# Patient Record
Sex: Female | Born: 1980 | Hispanic: Yes | Marital: Married | Smoking: Former smoker
Health system: Southern US, Community
[De-identification: ages and names within clinical notes are randomized; demographics above are authoritative.]

## PROBLEM LIST (undated history)

## (undated) ENCOUNTER — Ambulatory Visit (HOSPITAL_COMMUNITY): Admission: EM | Payer: Medicaid Other | Source: Home / Self Care

## (undated) DIAGNOSIS — F329 Major depressive disorder, single episode, unspecified: Secondary | ICD-10-CM

## (undated) DIAGNOSIS — F419 Anxiety disorder, unspecified: Secondary | ICD-10-CM

## (undated) DIAGNOSIS — R87619 Unspecified abnormal cytological findings in specimens from cervix uteri: Secondary | ICD-10-CM

## (undated) DIAGNOSIS — O139 Gestational [pregnancy-induced] hypertension without significant proteinuria, unspecified trimester: Secondary | ICD-10-CM

## (undated) DIAGNOSIS — F32A Depression, unspecified: Secondary | ICD-10-CM

## (undated) HISTORY — PX: TONSILLECTOMY AND ADENOIDECTOMY: SUR1326

## (undated) HISTORY — DX: Gestational (pregnancy-induced) hypertension without significant proteinuria, unspecified trimester: O13.9

## (undated) HISTORY — PX: COLPOSCOPY: SHX161

## (undated) HISTORY — DX: Anxiety disorder, unspecified: F41.9

## (undated) HISTORY — DX: Unspecified abnormal cytological findings in specimens from cervix uteri: R87.619

## (undated) HISTORY — PX: CERVICAL BIOPSY  W/ LOOP ELECTRODE EXCISION: SUR135

## (undated) HISTORY — DX: Depression, unspecified: F32.A

---
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
## 2017-09-28 ENCOUNTER — Emergency Department
Admission: EM | Admit: 2017-09-28 | Discharge: 2017-09-28 | Disposition: A | Discharge status: Home / Self Care | Payer: Self-pay | Attending: Emergency Medicine | Admitting: Emergency Medicine

## 2017-09-28 ENCOUNTER — Other Ambulatory Visit: Payer: Self-pay

## 2017-09-28 DIAGNOSIS — R1032 Left lower quadrant pain: Secondary | ICD-10-CM | POA: Insufficient documentation

## 2017-09-28 DIAGNOSIS — F17210 Nicotine dependence, cigarettes, uncomplicated: Secondary | ICD-10-CM | POA: Insufficient documentation

## 2017-09-28 DIAGNOSIS — R300 Dysuria: Secondary | ICD-10-CM | POA: Insufficient documentation

## 2017-09-28 DIAGNOSIS — N3090 Cystitis, unspecified without hematuria: Secondary | ICD-10-CM | POA: Insufficient documentation

## 2017-09-28 LAB — URINALYSIS, COMPLETE (UACMP) WITH MICROSCOPIC
BACTERIA UA: NONE SEEN
Bilirubin Urine: NEGATIVE
Glucose, UA: NEGATIVE mg/dL
Hgb urine dipstick: NEGATIVE
Ketones, ur: NEGATIVE mg/dL
Leukocytes, UA: NEGATIVE
Nitrite: NEGATIVE
Protein, ur: NEGATIVE mg/dL
SPECIFIC GRAVITY, URINE: 1.002 — AB (ref 1.005–1.030)
pH: 7 (ref 5.0–8.0)

## 2017-09-28 LAB — CBC WITH DIFFERENTIAL/PLATELET
BASOS ABS: 0.1 10*3/uL (ref 0–0.1)
Basophils Relative: 1 %
EOS ABS: 0.3 10*3/uL (ref 0–0.7)
EOS PCT: 4 %
HCT: 39.4 % (ref 35.0–47.0)
HEMOGLOBIN: 13.2 g/dL (ref 12.0–16.0)
LYMPHS ABS: 2.4 10*3/uL (ref 1.0–3.6)
LYMPHS PCT: 27 %
MCH: 31.2 pg (ref 26.0–34.0)
MCHC: 33.5 g/dL (ref 32.0–36.0)
MCV: 93.2 fL (ref 80.0–100.0)
Monocytes Absolute: 0.6 10*3/uL (ref 0.2–0.9)
Monocytes Relative: 7 %
NEUTROS PCT: 61 %
Neutro Abs: 5.5 10*3/uL (ref 1.4–6.5)
PLATELETS: 264 10*3/uL (ref 150–440)
RBC: 4.22 MIL/uL (ref 3.80–5.20)
RDW: 12.7 % (ref 11.5–14.5)
WBC: 8.9 10*3/uL (ref 3.6–11.0)

## 2017-09-28 LAB — COMPREHENSIVE METABOLIC PANEL
ALBUMIN: 4.5 g/dL (ref 3.5–5.0)
ALK PHOS: 82 U/L (ref 38–126)
ALT: 8 U/L — AB (ref 14–54)
AST: 18 U/L (ref 15–41)
Anion gap: 4 — ABNORMAL LOW (ref 5–15)
BUN: 9 mg/dL (ref 6–20)
CALCIUM: 9.1 mg/dL (ref 8.9–10.3)
CO2: 28 mmol/L (ref 22–32)
CREATININE: 0.75 mg/dL (ref 0.44–1.00)
Chloride: 103 mmol/L (ref 101–111)
GFR calc Af Amer: >60 mL/min (ref >60)
GFR calc non Af Amer: >60 mL/min (ref >60)
Glucose, Bld: 90 mg/dL (ref 65–99)
Potassium: 4 mmol/L (ref 3.5–5.1)
SODIUM: 135 mmol/L (ref 135–145)
Total Bilirubin: 0.6 mg/dL (ref 0.3–1.2)
Total Protein: 8 g/dL (ref 6.5–8.1)

## 2017-09-28 MED ORDER — MELOXICAM 15 MG PO TABS: 15.0000 mg | ORAL_TABLET | Freq: Every day | ORAL | 0 refills | Status: DC

## 2017-09-28 MED ORDER — PHENAZOPYRIDINE HCL 95 MG PO TABS: 95.0000 mg | ORAL_TABLET | Freq: Three times a day (TID) | ORAL | 0 refills | Status: DC | PRN

## 2017-09-28 NOTE — ED Notes (Signed)
See triage note.

## 2017-09-28 NOTE — ED Provider Notes (Signed)
[facility] Emergency Department Provider Note  ____________________________________________  Time seen: Approximately 3:51 PM  I have reviewed the triage vital signs and the nursing notes.   HISTORY  Chief Complaint Recurrent UTI    HPI Courtney Torres is a 37 y.o. female who presents emergency department complaining of urinary frequency, dysuria, flank pain.  Patient reports that she has a history of UTIs in the past, symptoms began the same way.  Patient reports that she had a UTI that turned into pyelonephritis and was borderline for admission in the past.  Patient reports that over the weekend she began to experience left flank pain.  She also has a feeling of urinary retention after urinating.  Patient denies any hematuria.  No abdominal pain.  No fevers or chills.  No recent strep infection or upper respiratory infection.  Patient has tried Azo several days ago with no relief.  No other medications for this complaint prior to arrival.  No recent antibiotic use.  History reviewed. No pertinent past medical history.  There are no active problems to display for this patient.   Past Surgical History:  Procedure Laterality Date  . CESAREAN SECTION      Prior to Admission medications   Medication Sig Start Date End Date Taking? Authorizing Provider  meloxicam (MOBIC) 15 MG tablet Take 1 tablet (15 mg total) by mouth daily. 09/28/17   Moorea Boissonneault, Delorise RoyalsJonathan D, PA-C  phenazopyridine (PYRIDIUM) 95 MG tablet Take 1 tablet (95 mg total) by mouth 3 (three) times daily as needed for pain. 09/28/17   Shaquoia Miers, Delorise RoyalsJonathan D, PA-C    Allergies Patient has no known allergies.  No family history on file.  Social History Social History   Tobacco Use  . Smoking status: Current Every Day Smoker    Packs/day: 0.50    Types: Cigarettes  . Smokeless tobacco: Never Used  Substance Use Topics  . Alcohol use: Yes    Comment: occ  . Drug use: No     Review of Systems   Constitutional: No fever/chills Eyes: No visual changes.  Cardiovascular: no chest pain. Respiratory: no cough. No SOB. Gastrointestinal: No abdominal pain.  No nausea, no vomiting.  No diarrhea.  No constipation. Genitourinary: Positive for dysuria.  For left flank pain.  Positive for feeling of urinary retention after urination.  No hematuria Musculoskeletal: Negative for musculoskeletal pain. Skin: Negative for rash, abrasions, lacerations, ecchymosis. Neurological: Negative for headaches, focal weakness or numbness. 10-point ROS otherwise negative.  ____________________________________________   PHYSICAL EXAM:  VITAL SIGNS: ED Triage Vitals  Enc Vitals Group     BP 09/28/17 1452 133/81     Pulse Rate 09/28/17 1452 71     Resp 09/28/17 1452 15     Temp 09/28/17 1452 98 F (36.7 C)     Temp Source 09/28/17 1452 Oral     SpO2 09/28/17 1452 99 %     Weight 09/28/17 1443 167 lb (75.8 kg)     Height 09/28/17 1443 5\' 4"  (1.626 m)     Head Circumference --      Peak Flow --      Pain Score 09/28/17 1443 7     Pain Loc --      Pain Edu? --      Excl. in GC? --      Constitutional: Alert and oriented. Well appearing and in no acute distress. Eyes: Conjunctivae are normal. PERRL. EOMI. Head: Atraumatic. ENT:      Ears:  Nose: No congestion/rhinnorhea.      Mouth/Throat: Mucous membranes are moist.  Neck: No stridor.    Cardiovascular: Normal rate, regular rhythm. Normal S1 and S2.  Good peripheral circulation. Respiratory: Normal respiratory effort without tachypnea or retractions. Lungs CTAB. Good air entry to the bases with no decreased or absent breath sounds. Gastrointestinal: Bowel sounds 4 quadrants. Soft and nontender to palpation. No guarding or rigidity. No palpable masses. No distention. No CVA tenderness. Musculoskeletal: Full range of motion to all extremities. No gross deformities appreciated. Neurologic:  Normal speech and language. No gross focal  neurologic deficits are appreciated.  Skin:  Skin is warm, dry and intact. No rash noted. Psychiatric: Mood and affect are normal. Speech and behavior are normal. Patient exhibits appropriate insight and judgement.   ____________________________________________   LABS (all labs ordered are listed, but only abnormal results are displayed)  Labs Reviewed  URINALYSIS, COMPLETE (UACMP) WITH MICROSCOPIC - Abnormal; Notable for the following components:      Result Value   Color, Urine COLORLESS (*)    APPearance CLEAR (*)    Specific Gravity, Urine 1.002 (*)    Squamous Epithelial / LPF 0-5 (*)    All other components within normal limits  COMPREHENSIVE METABOLIC PANEL - Abnormal; Notable for the following components:   ALT 8 (*)    Anion gap 4 (*)    All other components within normal limits  CBC WITH DIFFERENTIAL/PLATELET   ____________________________________________  EKG   ____________________________________________  RADIOLOGY   No results found.  ____________________________________________    PROCEDURES  Procedure(s) performed:    Procedures    Medications - No data to display   ____________________________________________   INITIAL IMPRESSION / ASSESSMENT AND PLAN / ED COURSE  Pertinent labs & imaging results that were available during my care of the patient were reviewed by me and considered in my medical decision making (see chart for details).  Review of the [place] CSRS was performed in accordance of the [facility] prior to dispensing any controlled drugs.  Clinical Course as of Sep 28 1712  Mon Sep 28, 2017  1607 Patient presents the emergency department with 2-week history of dysuria, polyuria, now with left flank pain.  Patient reports that she has a history of pyelonephritis in the past.  At this time, urinalysis returned with reassuring results with no findings.  However, patient states that "something just does not feel right.".  At this time, exam was  reassuring with no CVA tenderness.  Due to patient's complaints, I will evaluate with CBC and CMP.  If this returns with reassuring results, the patient will be discharged.  Further workup will be determined by labs.  [JC]    Clinical Course User Index [JC] Paiden Caraveo, Delorise Royals, PA-C    Patient's diagnosis is consistent with acute cystitis without hematuria.  Patient presented to the emergency department with UTI-like symptoms.  Patient's urinalysis and blood work returns with reassuring results.  At this time, differential initially included UTI, pyelonephritis, cystitis, structural abnormality.  At this time, results are reassuring, exam is reassuring.  Patient will be treated with anti-inflammatories and Pyridium.  If symptoms do not improve she will follow-up with urology for further investigation and management of this complaint.. Patient is given ED precautions to return to the ED for any worsening or new symptoms.     ____________________________________________  FINAL CLINICAL IMPRESSION(S) / ED DIAGNOSES  Final diagnoses:  Cystitis  Dysuria      NEW MEDICATIONS STARTED DURING THIS VISIT:  ED Discharge Orders        Ordered    phenazopyridine (PYRIDIUM) 95 MG tablet  3 times daily PRN     09/28/17 1714    meloxicam (MOBIC) 15 MG tablet  Daily     09/28/17 1714          This chart was dictated using voice recognition software/Dragon. Despite best efforts to proofread, errors can occur which can change the meaning. Any change was purely unintentional.    Racheal Patches, PA-C 09/28/17 1714    Sharyn Creamer, MD 09/29/17 [phone]

## 2017-09-28 NOTE — ED Triage Notes (Signed)
Pt states frequent urination for 2 weeks and yesterday her "kidneys started hurting" - she feels like she has urinary urgency but is unable to empty bladder

## 2018-09-23 ENCOUNTER — Encounter: Payer: Self-pay | Admitting: Family Medicine

## 2018-09-27 ENCOUNTER — Encounter: Payer: Self-pay | Admitting: Family Medicine

## 2018-09-27 ENCOUNTER — Ambulatory Visit (INDEPENDENT_AMBULATORY_CARE_PROVIDER_SITE_OTHER): Payer: Medicaid Other | Admitting: Family Medicine

## 2018-09-27 ENCOUNTER — Telehealth: Payer: Self-pay | Admitting: Family Medicine

## 2018-09-27 VITALS — BP 119/55 | HR 83 | Resp 17 | Ht 64.0 in | Wt 156.2 lb

## 2018-09-27 DIAGNOSIS — Z130 Encounter for screening for diseases of the blood and blood-forming organs and certain disorders involving the immune mechanism: Secondary | ICD-10-CM

## 2018-09-27 DIAGNOSIS — Z131 Encounter for screening for diabetes mellitus: Secondary | ICD-10-CM

## 2018-09-27 DIAGNOSIS — R030 Elevated blood-pressure reading, without diagnosis of hypertension: Secondary | ICD-10-CM

## 2018-09-27 DIAGNOSIS — Z1389 Encounter for screening for other disorder: Secondary | ICD-10-CM

## 2018-09-27 DIAGNOSIS — G4700 Insomnia, unspecified: Secondary | ICD-10-CM | POA: Diagnosis not present

## 2018-09-27 DIAGNOSIS — Z7689 Persons encountering health services in other specified circumstances: Secondary | ICD-10-CM

## 2018-09-27 DIAGNOSIS — Z13220 Encounter for screening for lipoid disorders: Secondary | ICD-10-CM

## 2018-09-27 LAB — POCT URINALYSIS DIP (CLINITEK)
Bilirubin, UA: NEGATIVE
GLUCOSE UA: NEGATIVE mg/dL
Ketones, POC UA: NEGATIVE mg/dL
NITRITE UA: NEGATIVE
PH UA: 6 (ref 5.0–8.0)
POC PROTEIN,UA: NEGATIVE
Spec Grav, UA: <=1.005 — AB (ref 1.010–1.025)
UROBILINOGEN UA: 0.2 U/dL

## 2018-09-27 MED ORDER — HYDROXYZINE HCL 50 MG PO TABS: 100.0000 mg | ORAL_TABLET | Freq: Every evening | ORAL | 1 refills | Status: DC | PRN

## 2018-09-27 NOTE — Progress Notes (Signed)
Courtney Torres Poucher, is a 38 y.o. female  [id number]  [id number]  DOB - 1981/04/14  CC:  Chief Complaint  Patient presents with  . Establish Care  . Hypertension    elevated BP readings w/no history of HTN       HPI: Courtney Torres is a 38 y.o. female is here today to establish care.   Courtney Torres Clymer does not have a problem list on file.   Reports that she checked her blood pressure over a month ago consistently for 1 week and obtaining readings greater than 140 systolic and greater than 100 diastolic. During that time she was smoking cigarettes and drinking almost daily.  She reports over the course of last month she is stop smoking completely and is no longer drinking alcohol.  She has not had a recent complete physical.  No recent fasting labs.  Reports a distant history of hypothyroidism which she was never prescribed medication although was told to have thyroid functioning was off and on repeat analysis thyroid hormone was normal.  Reports a history of generalized anxiety.  At some point she was treated with an SSRI and Xanax.  She reports weaning herself off of both medications.  She continues to have some residual insomnia which is exacerbated with anxiety.  She is tried multiple over-the-counter medications to assist with sleep and all have been unsuccessful with sedation.  Patient denies new headaches, chest pain, abdominal pain, nausea, new weakness , numbness or tingling, SOB, or edema.   Current medications: Current Outpatient Medications:  .  levonorgestrel (MIRENA) 20 MCG/24HR IUD, by Intrauterine route., Disp: , Rfl:    Pertinent family medical history: family history includes Heart failure in her sister; Hypertension in her brother, father, and paternal grandfather.   No Known Allergies  Social History   Socioeconomic History  . Marital status: Married    Spouse name: Not on file  . Number of children: 2  . Years of education: Not on file  . Highest education level: Not on  file  Occupational History  . Not on file  Social Needs  . Financial resource strain: Not on file  . Food insecurity:    Worry: Not on file    Inability: Not on file  . Transportation needs:    Medical: Not on file    Non-medical: Not on file  Tobacco Use  . Smoking status: Former Smoker    Packs/day: 0.50    Types: Cigarettes  . Smokeless tobacco: Current User  Substance and Sexual Activity  . Alcohol use: Yes    Comment: occ  . Drug use: No  . Sexual activity: Not on file  Lifestyle  . Physical activity:    Days per week: Not on file    Minutes per session: Not on file  . Stress: Not on file  Relationships  . Social connections:    Talks on phone: Not on file    Gets together: Not on file    Attends religious service: Not on file    Active member of club or organization: Not on file    Attends meetings of clubs or organizations: Not on file    Relationship status: Not on file  . Intimate partner violence:    Fear of current or ex partner: Not on file    Emotionally abused: Not on file    Physically abused: Not on file    Forced sexual activity: Not on file  Other Topics Concern  . Not on file  Social  History Narrative  . Not on file    Review of Systems: Pertinent negatives listed in HPI Objective:   Vitals:   09/27/18 0845  BP: (!) 119/55  Pulse: 83  Resp: 17  SpO2: 96%    BP Readings from Last 3 Encounters:  09/27/18 (!) 119/55  09/28/17 133/81    Filed Weights   09/27/18 0845  Weight: 156 lb 3.2 oz (70.9 kg)      Physical Exam: Constitutional: Patient appears well-developed and well-nourished. No distress. HENT: Normocephalic, atraumatic, External right and left ear normal. Oropharynx is clear and moist.  Eyes: Conjunctivae and EOM are normal. PERRLA, no scleral icterus. Neck: Normal ROM. Neck supple. No JVD. No tracheal deviation. No thyromegaly. CVS: RRR, S1/S2 +, no murmurs, no gallops, no carotid bruit.  Pulmonary: Effort and breath  sounds normal, no stridor, rhonchi, wheezes, rales.  Abdominal: Soft. BS +, no distension, tenderness, rebound or guarding.  Musculoskeletal: Normal range of motion. No edema and no tenderness.  Neuro: Alert. Normal muscle tone coordination. Normal gait. BUE and BLE strength 5/5 Skin: Skin is warm and dry. No rash noted. Not diaphoretic. No erythema. No pallor. Psychiatric: Anxious mood and affect. Behavior, judgment, thought content normal.  Lab Results (prior encounters)  Lab Results  Component Value Date   WBC 8.9 09/28/2017   HGB 13.2 09/28/2017   HCT 39.4 09/28/2017   MCV 93.2 09/28/2017   PLT 264 09/28/2017   Lab Results  Component Value Date   CREATININE 0.75 09/28/2017   BUN 9 09/28/2017   NA 135 09/28/2017   K 4.0 09/28/2017   CL 103 09/28/2017   CO2 28 09/28/2017       Assessment and plan:  1. Encounter to establish care 2. Elevated blood pressure reading -Blood pressure normotensive today.  Encourage patient to check blood pressure at home and keep a log of readings.  Advised to check blood pressure at the same time daily and avoid caffeine or alcohol prior to measuring blood pressure.  3. Screening cholesterol level - Thyroid Panel With TSH - Lipid panel  4. Screening for diabetes mellitus - Comprehensive metabolic panel - Hemoglobin A1c  5. Screening for deficiency anemia - CBC with Differential  6. Screening for blood or protein in urine - POCT URINALYSIS DIP (CLINITEK)  7. Insomnia, unspecified type -We will trial hydroxyzine 100 mg 1 hour prior to bedtime may repeat once if sleep is not achieved.   Return for PAP patient preference .   The patient was given clear instructions to go to ER or return to medical center if symptoms don't improve, worsen or new problems develop. The patient verbalized understanding. The patient was advised  to call and obtain lab results if they haven't heard anything from out office within 7-10 business days.  Joaquin Courts, FNP Primary Care at [facility] [address], [place] [address] [phone]: [phone]    This note has been created with Dragon speech recognition software and Paediatric nurse. Any transcriptional errors are unintentional.

## 2018-09-27 NOTE — Telephone Encounter (Signed)
Erroneous

## 2018-09-27 NOTE — Patient Instructions (Addendum)
[facility]!    Courtney Torres was seen by Joaquin CourtsKimberly Harris, FNP today.   Courtney Torres's primary care provider is Bing NeighborsHarris, Kimberly S, FNP.   For the best care possible, you should try to see Joaquin CourtsKimberly Harris, FNP-C whenever you come to the clinic.   We look forward to seeing you again soon!  If you have any questions about your visit today, please call us at [phone] or feel free to reach your primary care provider via MyChart.        How to Take Your Blood Pressure You can take your blood pressure at home with a machine. You may need to check your blood pressure at home:  To check if you have high blood pressure (hypertension).  To check your blood pressure over time.  To make sure your blood pressure medicine is working. Supplies needed: You will need a blood pressure machine, or monitor. You can buy one at a drugstore or online. When choosing one:  Choose one with an arm cuff.  Choose one that wraps around your upper arm. Only one finger should fit between your arm and the cuff.  Do not choose one that measures your blood pressure from your wrist or finger. Your doctor can suggest a monitor. How to prepare Avoid these things for 30 minutes before checking your blood pressure:  Drinking caffeine.  Drinking alcohol.  Eating.  Smoking.  Exercising. Five minutes before checking your blood pressure:  Pee.  Sit in a dining chair. Avoid sitting in a soft couch or armchair.  Be quiet. Do not talk. How to take your blood pressure Follow the instructions that came with your machine. If you have a digital blood pressure monitor, these may be the instructions: 1. Sit up straight. 2. Place your feet on the floor. Do not cross your ankles or legs. 3. Rest your left arm at the level of your heart. You may rest it on a table, desk, or chair. 4. Pull up your shirt sleeve. 5. Wrap the blood pressure cuff  around the upper part of your left arm. The cuff should be 1 inch (2.5 cm) above your elbow. It is best to wrap the cuff around bare skin. 6. Fit the cuff snugly around your arm. You should be able to place only one finger between the cuff and your arm. 7. Put the cord inside the groove of your elbow. 8. Press the power button. 9. Sit quietly while the cuff fills with air and loses air. 10. Write down the numbers on the screen. 11. Wait 2-3 minutes and then repeat steps 1-10. What do the numbers mean? Two numbers make up your blood pressure. The first number is called systolic pressure. The second is called diastolic pressure. An example of a blood pressure reading is "120 over 80" (or 120/80). If you are an adult and do not have a medical condition, use this guide to find out if your blood pressure is normal: Normal  First number: below 120.  Second number: below 80. Elevated  First number: 120-129.  Second number: below 80. Hypertension stage 1  First number: 130-139.  Second number: 80-89. Hypertension stage 2  First number: 140 or above.  Second number: 90 or above. Your blood pressure is above normal even if only the top or bottom number is above normal. Follow these instructions at home:  Check your blood pressure as often as your doctor tells  you to.  Take your monitor to your next doctor's appointment. Your doctor will: ? Make sure you are using it correctly. ? Make sure it is working right.  Make sure you understand what your blood pressure numbers should be.  Tell your doctor if your medicines are causing side effects. Contact a doctor if:  Your blood pressure keeps being high. Get help right away if:  Your first blood pressure number is higher than 180.  Your second blood pressure number is higher than 120. This information is not intended to replace advice given to you by your health care provider. Make sure you discuss any questions you have with your  health care provider. Document Released: 07/24/2008 Document Revised: 07/09/2016 Document Reviewed: 01/18/2016 Elsevier Interactive Patient Education  2019 Elsevier Inc.    Insomnia Insomnia is a sleep disorder that makes it difficult to fall asleep or stay asleep. Insomnia can cause fatigue, low energy, difficulty concentrating, mood swings, and poor performance at work or school. There are three different ways to classify insomnia:  Difficulty falling asleep.  Difficulty staying asleep.  Waking up too early in the morning. Any type of insomnia can be long-term (chronic) or short-term (acute). Both are common. Short-term insomnia usually lasts for three months or less. Chronic insomnia occurs at least three times a week for longer than three months. What are the causes? Insomnia may be caused by another condition, situation, or substance, such as:  Anxiety.  Certain medicines.  Gastroesophageal reflux disease (GERD) or other gastrointestinal conditions.  Asthma or other breathing conditions.  Restless legs syndrome, sleep apnea, or other sleep disorders.  Chronic pain.  Menopause.  Stroke.  Abuse of alcohol, tobacco, or illegal drugs.  Mental health conditions, such as depression.  Caffeine.  Neurological disorders, such as Alzheimer's disease.  An overactive thyroid (hyperthyroidism). Sometimes, the cause of insomnia may not be known. What increases the risk? Risk factors for insomnia include:  Gender. Women are affected more often than men.  Age. Insomnia is more common as you get older.  Stress.  Lack of exercise.  Irregular work schedule or working night shifts.  Traveling between different time zones.  Certain medical and mental health conditions. What are the signs or symptoms? If you have insomnia, the main symptom is having trouble falling asleep or having trouble staying asleep. This may lead to other symptoms, such as:  Feeling fatigued or  having low energy.  Feeling nervous about going to sleep.  Not feeling rested in the morning.  Having trouble concentrating.  Feeling irritable, anxious, or depressed. How is this diagnosed? This condition may be diagnosed based on:  Your symptoms and medical history. Your health care provider may ask about: ? Your sleep habits. ? Any medical conditions you have. ? Your mental health.  A physical exam. How is this treated? Treatment for insomnia depends on the cause. Treatment may focus on treating an underlying condition that is causing insomnia. Treatment may also include:  Medicines to help you sleep.  Counseling or therapy.  Lifestyle adjustments to help you sleep better. Follow these instructions at home: Eating and drinking   Limit or avoid alcohol, caffeinated beverages, and cigarettes, especially close to bedtime. These can disrupt your sleep.  Do not eat a large meal or eat spicy foods right before bedtime. This can lead to digestive discomfort that can make it hard for you to sleep. Sleep habits   Keep a sleep diary to help you and your health care provider  figure out what could be causing your insomnia. Write down: ? When you sleep. ? When you wake up during the night. ? How well you sleep. ? How rested you feel the next day. ? Any side effects of medicines you are taking. ? What you eat and drink.  Make your bedroom a dark, comfortable place where it is easy to fall asleep. ? Put up shades or blackout curtains to block light from outside. ? Use a white noise machine to block noise. ? Keep the temperature cool.  Limit screen use before bedtime. This includes: ? Watching TV. ? Using your smartphone, tablet, or computer.  Stick to a routine that includes going to bed and waking up at the same times every day and night. This can help you fall asleep faster. Consider making a quiet activity, such as reading, part of your nighttime routine.  Try to avoid  taking naps during the day so that you sleep better at night.  Get out of bed if you are still awake after 15 minutes of trying to sleep. Keep the lights down, but try reading or doing a quiet activity. When you feel sleepy, go back to bed. General instructions  Take over-the-counter and prescription medicines only as told by your health care provider.  Exercise regularly, as told by your health care provider. Avoid exercise starting several hours before bedtime.  Use relaxation techniques to manage stress. Ask your health care provider to suggest some techniques that may work well for you. These may include: ? Breathing exercises. ? Routines to release muscle tension. ? Visualizing peaceful scenes.  Make sure that you drive carefully. Avoid driving if you feel very sleepy.  Keep all follow-up visits as told by your health care provider. This is important. Contact a health care provider if:  You are tired throughout the day.  You have trouble in your daily routine due to sleepiness.  You continue to have sleep problems, or your sleep problems get worse. Get help right away if:  You have serious thoughts about hurting yourself or someone else. If you ever feel like you may hurt yourself or others, or have thoughts about taking your own life, get help right away. You can go to your nearest emergency department or call:  Your local emergency services (911 in the [place]  A suicide crisis helpline, such as the National Suicide Prevention Lifeline at (289) 515-70951-725-038-0257. This is open 24 hours a day. Summary  Insomnia is a sleep disorder that makes it difficult to fall asleep or stay asleep.  Insomnia can be long-term (chronic) or short-term (acute).  Treatment for insomnia depends on the cause. Treatment may focus on treating an underlying condition that is causing insomnia.  Keep a sleep diary to help you and your health care provider figure out what could be causing your  insomnia. This information is not intended to replace advice given to you by your health care provider. Make sure you discuss any questions you have with your health care provider. Document Released: 08/08/2000 Document Revised: 05/21/2017 Document Reviewed: 05/21/2017 Elsevier Interactive Patient Education  2019 [employer]

## 2018-09-28 LAB — CBC WITH DIFFERENTIAL/PLATELET
BASOS ABS: 0.1 10*3/uL (ref 0.0–0.2)
BASOS: 2 %
EOS (ABSOLUTE): 0.4 10*3/uL (ref 0.0–0.4)
Eos: 5 %
HEMOGLOBIN: 12.8 g/dL (ref 11.1–15.9)
Hematocrit: 36.2 % (ref 34.0–46.6)
Immature Grans (Abs): 0 10*3/uL (ref 0.0–0.1)
Immature Granulocytes: 0 %
LYMPHS: 24 %
Lymphocytes Absolute: 1.8 10*3/uL (ref 0.7–3.1)
MCH: 32.4 pg (ref 26.6–33.0)
MCHC: 35.4 g/dL (ref 31.5–35.7)
MCV: 92 fL (ref 79–97)
Monocytes Absolute: 0.6 10*3/uL (ref 0.1–0.9)
Monocytes: 8 %
NEUTROS ABS: 4.5 10*3/uL (ref 1.4–7.0)
Neutrophils: 61 %
Platelets: 283 10*3/uL (ref 150–450)
RBC: 3.95 x10E6/uL (ref 3.77–5.28)
RDW: 11.8 % (ref 11.7–15.4)
WBC: 7.4 10*3/uL (ref 3.4–10.8)

## 2018-09-28 LAB — COMPREHENSIVE METABOLIC PANEL
A/G RATIO: 1.6 (ref 1.2–2.2)
ALBUMIN: 4.7 g/dL (ref 3.8–4.8)
ALT: 11 IU/L (ref 0–32)
AST: 16 IU/L (ref 0–40)
Alkaline Phosphatase: 69 IU/L (ref 39–117)
BILIRUBIN TOTAL: 0.3 mg/dL (ref 0.0–1.2)
BUN/Creatinine Ratio: 14 (ref 9–23)
BUN: 9 mg/dL (ref 6–20)
CALCIUM: 9.8 mg/dL (ref 8.7–10.2)
CHLORIDE: 101 mmol/L (ref 96–106)
CO2: 22 mmol/L (ref 20–29)
Creatinine, Ser: 0.64 mg/dL (ref 0.57–1.00)
GFR, EST AFRICAN AMERICAN: 134 mL/min/{1.73_m2} (ref >59)
GFR, EST NON AFRICAN AMERICAN: 116 mL/min/{1.73_m2} (ref >59)
GLOBULIN, TOTAL: 2.9 g/dL (ref 1.5–4.5)
Glucose: 103 mg/dL — ABNORMAL HIGH (ref 65–99)
POTASSIUM: 4.6 mmol/L (ref 3.5–5.2)
SODIUM: 138 mmol/L (ref 134–144)
TOTAL PROTEIN: 7.6 g/dL (ref 6.0–8.5)

## 2018-09-28 LAB — THYROID PANEL WITH TSH
FREE THYROXINE INDEX: 1.8 (ref 1.2–4.9)
T3 Uptake Ratio: 29 % (ref 24–39)
T4, Total: 6.2 ug/dL (ref 4.5–12.0)
TSH: 3.29 u[IU]/mL (ref 0.450–4.500)

## 2018-09-28 LAB — LIPID PANEL
CHOLESTEROL TOTAL: 190 mg/dL (ref 100–199)
Chol/HDL Ratio: 3.2 ratio (ref 0.0–4.4)
HDL: 59 mg/dL (ref >39)
LDL Calculated: 120 mg/dL — ABNORMAL HIGH (ref 0–99)
Triglycerides: 53 mg/dL (ref 0–149)
VLDL Cholesterol Cal: 11 mg/dL (ref 5–40)

## 2018-09-28 LAB — HEMOGLOBIN A1C
ESTIMATED AVERAGE GLUCOSE: 88 mg/dL
HEMOGLOBIN A1C: 4.7 % — AB (ref 4.8–5.6)

## 2018-10-26 ENCOUNTER — Encounter: Payer: Self-pay | Admitting: Family Medicine

## 2018-10-26 ENCOUNTER — Other Ambulatory Visit (HOSPITAL_COMMUNITY)
Admission: RE | Admit: 2018-10-26 | Discharge: 2018-10-26 | Disposition: A | Discharge status: Home / Self Care | Payer: Medicaid Other | Source: Ambulatory Visit | Attending: Family Medicine | Admitting: Family Medicine

## 2018-10-26 ENCOUNTER — Ambulatory Visit (INDEPENDENT_AMBULATORY_CARE_PROVIDER_SITE_OTHER): Payer: Medicaid Other | Admitting: Family Medicine

## 2018-10-26 VITALS — BP 125/82 | HR 72 | Temp 98.3°F | Resp 17 | Ht 64.0 in | Wt 151.4 lb

## 2018-10-26 DIAGNOSIS — Z0000 Encounter for general adult medical examination without abnormal findings: Secondary | ICD-10-CM

## 2018-10-26 DIAGNOSIS — Z3202 Encounter for pregnancy test, result negative: Secondary | ICD-10-CM

## 2018-10-26 DIAGNOSIS — Z1389 Encounter for screening for other disorder: Secondary | ICD-10-CM

## 2018-10-26 DIAGNOSIS — Z01419 Encounter for gynecological examination (general) (routine) without abnormal findings: Secondary | ICD-10-CM

## 2018-10-26 LAB — POCT URINALYSIS DIP (CLINITEK)
Bilirubin, UA: NEGATIVE
Glucose, UA: NEGATIVE mg/dL
Ketones, POC UA: NEGATIVE mg/dL
Leukocytes, UA: NEGATIVE
Nitrite, UA: NEGATIVE
PH UA: 6 (ref 5.0–8.0)
POC PROTEIN,UA: NEGATIVE
RBC UA: NEGATIVE
Urobilinogen, UA: 0.2 E.U./dL

## 2018-10-26 LAB — POCT URINE PREGNANCY: PREG TEST UR: NEGATIVE

## 2018-10-26 NOTE — Progress Notes (Signed)
Patient notified of results & recommendations during office. Expressed understanding. Lab letter printed as well.

## 2018-10-26 NOTE — Patient Instructions (Signed)
Pap Test  Why am I having this test?  A Pap test, also called a Pap smear, is a screening test to check for signs of:  · Cancer of the vagina, cervix, and uterus. The cervix is the lower part of the uterus that opens into the vagina.  · Infection.  · Changes that may be a sign that cancer is developing (precancerous changes).  Women need this test on a regular basis. In general, you should have a Pap test every 3 years until you reach menopause or age 36. Women aged 30-60 may choose to have their Pap test done at the same time as an HPV (human papillomavirus) test every 5 years (instead of every 3 years).  Your health care provider may recommend having Pap tests more or less often depending on your medical conditions and past Pap test results.  What kind of sample is taken?    Your health care provider will collect a sample of cells from the surface of your cervix. This will be done using a small cotton swab, plastic spatula, or brush. This sample is often collected during a pelvic exam, when you are lying on your back on an exam table with feet in footrests (stirrups).  In some cases, fluids (secretions) from the cervix or vagina may also be collected.  How do I prepare for this test?  · Be aware of where you are in your menstrual cycle. If you are menstruating on the day of the test, you may be asked to reschedule.  · You may need to reschedule if you have a known vaginal infection on the day of the test.  · Follow instructions from your health care provider about:  ? Changing or stopping your regular medicines. Some medicines can cause abnormal test results, such as digitalis and tetracycline.  ? Avoiding douching or taking a bath the day before or the day of the test.  Tell a health care provider about:  · Any allergies you have.  · All medicines you are taking, including vitamins, herbs, eye drops, creams, and over-the-counter medicines.  · Any blood disorders you have.  · Any surgeries you have had.  · Any  medical conditions you have.  · Whether you are pregnant or may be pregnant.  How are the results reported?  Your test results will be reported as either abnormal or normal.  A false-positive result can occur. A false positive is incorrect because it means that a condition is present when it is not.  A false-negative result can occur. A false negative is incorrect because it means that a condition is not present when it is.  What do the results mean?  A normal test result means that you do not have signs of cancer of the vagina, cervix, or uterus.  An abnormal result may mean that you have:  · Cancer. A Pap test by itself is not enough to diagnose cancer. You will have more tests done in this case.  · Precancerous changes in your vagina, cervix, or uterus.  · Inflammation of the cervix.  · An STD (sexually transmitted disease).  · A fungal infection.  · A parasite infection.  Talk with your health care provider about what your results mean.  Questions to ask your health care provider  Ask your health care provider, or the department that is doing the test:  · When will my results be ready?  · How will I get my results?  · What are my   treatment options?  · What other tests do I need?  · What are my next steps?  Summary  · In general, women should have a Pap test every 3 years until they reach menopause or age 36.  · Your health care provider will collect a sample of cells from the surface of your cervix. This will be done using a small cotton swab, plastic spatula, or brush.  · In some cases, fluids (secretions) from the cervix or vagina may also be collected.  This information is not intended to replace advice given to you by your health care provider. Make sure you discuss any questions you have with your health care provider.  Document Released: 11/01/2002 Document Revised: 04/20/2017 Document Reviewed: 04/20/2017  Elsevier Interactive Patient Education © 2019 [employer].

## 2018-10-26 NOTE — Progress Notes (Signed)
Patient ID: Courtney Torres, female    DOB: 11/22/1980, 38 y.o.   MRN: [id number]  PCP: Bing Neighbors, FNP  Chief Complaint  Patient presents with  . Gynecologic Exam    Subjective:  HPI Courtney Torres is a 38 y.o. female presents for a routine gynecological exam. Patient reports a distant history of an abnormal Pap almost 13 years ago requiring not colposcopy. She has had normal Paps since that time.  She currently has an IUD in place which is approximately 62 months old.  She has no family history of any gynecological cancers. She does have family history of a paternal grandmother 42 years old currently battling breast cancer. She has no history of her mother's family history.  Performs routine self breast exams denies any palpable lumps. Social History   Socioeconomic History  . Marital status: Married    Spouse name: Not on file  . Number of children: 2  . Years of education: Not on file  . Highest education level: Not on file  Occupational History  . Not on file  Social Needs  . Financial resource strain: Not on file  . Food insecurity:    Worry: Not on file    Inability: Not on file  . Transportation needs:    Medical: Not on file    Non-medical: Not on file  Tobacco Use  . Smoking status: Former Smoker    Packs/day: 0.50    Types: Cigarettes  . Smokeless tobacco: Current User  Substance and Sexual Activity  . Alcohol use: Yes    Comment: occ  . Drug use: No  . Sexual activity: Not on file  Lifestyle  . Physical activity:    Days per week: Not on file    Minutes per session: Not on file  . Stress: Not on file  Relationships  . Social connections:    Talks on phone: Not on file    Gets together: Not on file    Attends religious service: Not on file    Active member of club or organization: Not on file    Attends meetings of clubs or organizations: Not on file    Relationship status: Not on file  . Intimate partner violence:    Fear of current or ex partner:  Not on file    Emotionally abused: Not on file    Physically abused: Not on file    Forced sexual activity: Not on file  Other Topics Concern  . Not on file  Social History Narrative  . Not on file    Family History  Problem Relation Age of Onset  . Hypertension Father   . Heart failure Sister   . Hypertension Brother   . Hypertension Paternal Grandfather      Review of Systems Pertinent negatives listed in HPI There are no active problems to display for this patient.   No Known Allergies  Prior to Admission medications   Medication Sig Start Date End Date Taking? Authorizing Provider  hydrOXYzine (ATARAX/VISTARIL) 50 MG tablet Take 2 tablets (100 mg total) by mouth at bedtime and may repeat dose one time if needed. 09/27/18  Yes Bing Neighbors, FNP  levonorgestrel (MIRENA) 20 MCG/24HR IUD by Intrauterine route. 05/31/18  Yes [provider]    Past Medical, Surgical Family and Social History reviewed and updated.    Objective:   Today's Vitals   10/26/18 0932  BP: 125/82  Pulse: 72  Resp: 17  Temp: 98.3 F (36.8 C)  TempSrc: Oral  SpO2: 97%  Weight: 151 lb 6.4 oz (68.7 kg)  Height: 5\' 4"  (1.626 m)    Wt Readings from Last 3 Encounters:  10/26/18 151 lb 6.4 oz (68.7 kg)  09/27/18 156 lb 3.2 oz (70.9 kg)  09/28/17 167 lb (75.8 kg)    Physical Exam General appearance: alert, well developed, well nourished, cooperative and in no distress Head: Normocephalic, without obvious abnormality, atraumatic Respiratory: Respirations even and unlabored, normal respiratory rate Heart: rate and rhythm normal. No gallop or murmurs noted on exam  Genitourinary: Breasts are symmetric without cutaneous changes, nipple inversion or discharge. No masses or tenderness, and no axillary lymphadenopathy. Normal female external genitalia without lesion. No inguinal lymphadenopathy. Vaginal mucosa is pink and moist without lesions. Cervix is closed without discharge, not  friable. IUD string partially visible. Pap smear obtained. No cervical motion tenderness, adnexal fullness or tenderness. Extremities: No gross deformities Skin: Skin color, texture, turgor normal. No rashes seen  Psych: Appropriate mood and affect. Neurologic: Mental status: Alert, oriented to person, place, and time, thought content appropriate.   Assessment & Plan:  1. Encounter for well woman exam with routine gynecological exam Routine gynecological exam without  - POCT urine pregnancy - Cytology - PAP([facility]) - Cervicovaginal ancillary only - POCT URINALYSIS DIP (CLINITEK)  Orders Placed This Encounter  Procedures  . POCT urine pregnancy  . POCT URINALYSIS DIP (CLINITEK)    -The patient was given clear instructions to go to ER or return to medical center if symptoms do not improve, worsen or new problems develop. The patient verbalized understanding.    Joaquin Courts, FNP Primary Care at [facility] [address] [place] [address] [phone]: [phone]

## 2018-10-27 LAB — CYTOLOGY - PAP: Diagnosis: NEGATIVE

## 2018-10-28 LAB — CERVICOVAGINAL ANCILLARY ONLY
Bacterial vaginitis: POSITIVE — AB
CHLAMYDIA, DNA PROBE: NEGATIVE
Candida vaginitis: NEGATIVE
Neisseria Gonorrhea: NEGATIVE
Trichomonas: NEGATIVE

## 2018-10-29 ENCOUNTER — Other Ambulatory Visit: Payer: Self-pay | Admitting: Family Medicine

## 2018-10-29 MED ORDER — METRONIDAZOLE 500 MG PO TABS: 500.0000 mg | ORAL_TABLET | Freq: Two times a day (BID) | ORAL | 0 refills | Status: AC

## 2018-10-29 NOTE — Telephone Encounter (Signed)
Patient notified of pap smear & nuswab results & recommendations. Expressed understanding. Rx release to the pharmacy on file.

## 2018-10-29 NOTE — Telephone Encounter (Signed)
PAP was negative for abnormal cells, however did indicate the presence of bacterial vaginosis. I am prescribing metronidazole 500 mg BID x 7 days for treatment of BV. Patient should avoid drinking alcohol or taking medication that contains alcohol.

## 2018-11-28 ENCOUNTER — Other Ambulatory Visit: Payer: Self-pay | Admitting: Family Medicine

## 2019-03-04 ENCOUNTER — Other Ambulatory Visit: Payer: Self-pay | Admitting: Family Medicine

## 2019-03-04 NOTE — Telephone Encounter (Signed)
Forwarding medication refill to PCP for review. 

## 2019-04-13 ENCOUNTER — Other Ambulatory Visit: Payer: Self-pay | Admitting: Family Medicine

## 2019-04-13 NOTE — Telephone Encounter (Signed)
Please advise 

## 2019-04-27 ENCOUNTER — Telehealth: Payer: Self-pay

## 2019-04-27 NOTE — Telephone Encounter (Signed)
Called patient to do their pre-visit COVID screening.  Have you tested positive for COVID or are you currently waiting for COVID test results? no  Have you recently traveled internationally([place], [place], [place], [place], [place]) or within the [place] to a hotspot area([place], [place], [place], [place], [place])? no  Are you currently experiencing any of the following symptoms: fever, cough, SHOB, fatigue, body aches, loss of smell/taste, rash, diarrhea, vomiting, severe headaches, weakness, sore throat? no  Have you been in contact with anyone who has recently travelled? no  Have you been in contact with anyone who is experiencing any of the above symptoms or been diagnosed with COVID  or works in or has recently visited a SNF? no  Asked patient to come in fasting for repeat labs. 

## 2019-04-28 ENCOUNTER — Ambulatory Visit (INDEPENDENT_AMBULATORY_CARE_PROVIDER_SITE_OTHER): Payer: Medicaid Other | Admitting: Family Medicine

## 2019-04-28 ENCOUNTER — Other Ambulatory Visit: Payer: Self-pay

## 2019-04-28 VITALS — BP 118/79 | HR 72 | Temp 97.3°F | Resp 17 | Ht 64.0 in | Wt 143.4 lb

## 2019-04-28 DIAGNOSIS — E785 Hyperlipidemia, unspecified: Secondary | ICD-10-CM | POA: Diagnosis not present

## 2019-04-28 DIAGNOSIS — Z23 Encounter for immunization: Secondary | ICD-10-CM

## 2019-04-28 DIAGNOSIS — G4709 Other insomnia: Secondary | ICD-10-CM | POA: Diagnosis not present

## 2019-04-28 DIAGNOSIS — F411 Generalized anxiety disorder: Secondary | ICD-10-CM

## 2019-04-28 DIAGNOSIS — Z13228 Encounter for screening for other metabolic disorders: Secondary | ICD-10-CM

## 2019-04-28 DIAGNOSIS — Z1159 Encounter for screening for other viral diseases: Secondary | ICD-10-CM

## 2019-04-28 MED ORDER — HYDROXYZINE HCL 50 MG PO TABS: ORAL_TABLET | ORAL | 1 refills | Status: DC

## 2019-04-28 MED ORDER — BUSPIRONE HCL 7.5 MG PO TABS: 7.5000 mg | ORAL_TABLET | Freq: Two times a day (BID) | ORAL | 1 refills | Status: DC

## 2019-04-28 NOTE — Progress Notes (Signed)
Subjective:  Patient ID: Courtney Torres, female    DOB: 20-Oct-1980  Age: 38 y.o. MRN: [id number]  CC: Hyperlipidemia   HPI Courtney Torres presents for a follow up visit. She is on Hydroxyzine for chronic insomnia which she has had for years. Tried Trazodone in the past which made her like a zombie, Melatonin was ineffective. She sometimes takes an extra 60m pill of Hydroxyzine to help her sleep but notes current regimen has been effective. 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 In the past she has tried Prozac, Zoloft and is not keen on doing those again. She exercises regularly and is on the keto diet.  Past Medical History:  Diagnosis Date  . Abnormal Pap smear of cervix   . Anxiety     Past Surgical History:  Procedure Laterality Date  . CERVICAL BIOPSY  W/ LOOP ELECTRODE EXCISION    . CESAREAN SECTION    . COLPOSCOPY    . TONSILLECTOMY AND ADENOIDECTOMY      Family History  Problem Relation Age of Onset  . Hypertension Father   . Heart failure Sister   . Hypertension Brother   . Hypertension Paternal Grandfather     No Known Allergies  Outpatient Medications Prior to Visit  Medication Sig Dispense Refill  . levonorgestrel (MIRENA) 20 MCG/24HR IUD by Intrauterine route.    . hydrOXYzine (ATARAX/VISTARIL) 50 MG tablet TAKE 2 TABLETS BY MOUTH AT BEDTIME(REPEAT DOSE 1 TIME IF NEEDED) 60 tablet 0   No facility-administered medications prior to visit.      ROS Review of Systems  Constitutional: Negative for activity change, appetite change and fatigue.  HENT: Negative for congestion, sinus pressure and sore throat.   Eyes: Negative for visual disturbance.  Respiratory: Negative for cough, chest tightness, shortness of breath and wheezing.   Cardiovascular: Negative for chest pain and palpitations.  Gastrointestinal: Positive for constipation. Negative for abdominal  distention and abdominal pain.  Endocrine: Negative for polydipsia.  Genitourinary: Negative for dysuria and frequency.  Musculoskeletal: Negative for arthralgias and back pain.  Skin: Negative for rash.  Neurological: Negative for tremors, light-headedness and numbness.  Hematological: Does not bruise/bleed easily.  Psychiatric/Behavioral: Negative for agitation and behavioral problems.    Objective:  BP 118/79   Pulse 72   Temp (!) 97.3 F (36.3 C) (Temporal)   Resp 17   Ht 5' 4" (1.626 m)   Wt 143 lb 6.4 oz (65 kg)   SpO2 98%   BMI 24.61 kg/m   BP/Weight 04/28/2019 34/3/154020/03/6760 Systolic BP [id number] Diastolic BP 79 82 55  Wt. (Lbs) 143.4 151.4 156.2  BMI 24.61 25.99 26.81      Physical Exam Constitutional:      Appearance: She is well-developed.  Cardiovascular:     Rate and Rhythm: Normal rate.     Heart sounds: Normal heart sounds. No murmur.  Pulmonary:     Effort: Pulmonary effort is normal.     Breath sounds: Normal breath sounds. No wheezing or rales.  Chest:     Chest wall: No tenderness.  Abdominal:     General: Bowel sounds are normal. There is no distension.     Palpations: Abdomen is soft. There is no mass.     Tenderness: There is no abdominal tenderness.  Musculoskeletal: Normal range of motion.  Neurological:     Mental Status: She  is alert and oriented to person, place, and time.     CMP Latest Ref Rng & Units 09/27/2018 09/28/2017  Glucose 65 - 99 mg/dL 103(H) 90  BUN 6 - 20 mg/dL 9 9  Creatinine 0.57 - 1.00 mg/dL 0.64 0.75  Sodium 134 - 144 mmol/L 138 135  Potassium 3.5 - 5.2 mmol/L 4.6 4.0  Chloride 96 - 106 mmol/L 101 103  CO2 20 - 29 mmol/L 22 28  Calcium 8.7 - 10.2 mg/dL 9.8 9.1  Total Protein 6.0 - 8.5 g/dL 7.6 8.0  Total Bilirubin 0.0 - 1.2 mg/dL 0.3 0.6  Alkaline Phos 39 - 117 IU/L 69 82  AST 0 - 40 IU/L 16 18  ALT 0 - 32 IU/L 11 8(L)    Lipid Panel     Component Value Date/Time   CHOL 190 09/27/2018 0922   TRIG 53  09/27/2018 0922   HDL 59 09/27/2018 0922   CHOLHDL 3.2 09/27/2018 0922   LDLCALC 120 (H) 09/27/2018 0922    CBC    Component Value Date/Time   WBC 7.4 09/27/2018 0922   WBC 8.9 09/28/2017 1606   RBC 3.95 09/27/2018 0922   RBC 4.22 09/28/2017 1606   HGB 12.8 09/27/2018 0922   HCT 36.2 09/27/2018 0922   PLT 283 09/27/2018 0922   MCV 92 09/27/2018 0922   MCH 32.4 09/27/2018 0922   MCH 31.2 09/28/2017 1606   MCHC 35.4 09/27/2018 0922   MCHC 33.5 09/28/2017 1606   RDW 11.8 09/27/2018 0922   LYMPHSABS 1.8 09/27/2018 0922   MONOABS 0.6 09/28/2017 1606   EOSABS 0.4 09/27/2018 0922   BASOSABS 0.1 09/27/2018 8182    Lab Results  Component Value Date   HGBA1C 4.7 (L) 09/27/2018    Assessment & Plan:   1. Generalized anxiety disorder Several stressors contributing Declines SSRI Commence Buspar Consider Psychotherapy if uncontrolled - busPIRone (BUSPAR) 7.5 MG tablet; Take 1 tablet (7.5 mg total) by mouth 2 (two) times daily.  Dispense: 60 tablet; Refill: 1  2. Needs flu shot - Flu Vaccine QUAD 6+ mos PF IM (Fluarix Quad PF)  3. Other insomnia Controlled on current regimen - hydrOXYzine (ATARAX/VISTARIL) 50 MG tablet; TAKE 2 TABLETS BY MOUTH AT BEDTIME(REPEAT DOSE 1 TIME IF NEEDED)  Dispense: 180 tablet; Refill: 1  4. Screening for metabolic disorder - XHB71+IRCV - Lipid panel   Health Care Maintenance: HIV test and Flu shot today Meds ordered this encounter  Medications  . hydrOXYzine (ATARAX/VISTARIL) 50 MG tablet    Sig: TAKE 2 TABLETS BY MOUTH AT BEDTIME(REPEAT DOSE 1 TIME IF NEEDED)    Dispense:  180 tablet    Refill:  1  . busPIRone (BUSPAR) 7.5 MG tablet    Sig: Take 1 tablet (7.5 mg total) by mouth 2 (two) times daily.    Dispense:  60 tablet    Refill:  1    Follow-up: Return in about 6 weeks (around 06/09/2019) for follow up on anxiety.       Charlott Rakes, MD, FAAFP. [facility] [place], [place]    04/28/2019, 9:31 AM

## 2019-05-03 LAB — LIPID PANEL
Chol/HDL Ratio: 2.9 ratio (ref 0.0–4.4)
Cholesterol, Total: 194 mg/dL (ref 100–199)
HDL: 67 mg/dL (ref >39)
LDL Chol Calc (NIH): 117 mg/dL — ABNORMAL HIGH (ref 0–99)
Triglycerides: 54 mg/dL (ref 0–149)
VLDL Cholesterol Cal: 10 mg/dL (ref 5–40)

## 2019-05-03 LAB — CMP14+EGFR
ALT: 7 IU/L (ref 0–32)
AST: 15 IU/L (ref 0–40)
Albumin/Globulin Ratio: 1.7 (ref 1.2–2.2)
Albumin: 4.5 g/dL (ref 3.8–4.8)
Alkaline Phosphatase: 67 IU/L (ref 39–117)
BUN/Creatinine Ratio: 25 — ABNORMAL HIGH (ref 9–23)
BUN: 16 mg/dL (ref 6–20)
Bilirubin Total: 0.5 mg/dL (ref 0.0–1.2)
CO2: 21 mmol/L (ref 20–29)
Calcium: 9.6 mg/dL (ref 8.7–10.2)
Chloride: 102 mmol/L (ref 96–106)
Creatinine, Ser: 0.65 mg/dL (ref 0.57–1.00)
GFR calc Af Amer: 132 mL/min/{1.73_m2} (ref >59)
GFR calc non Af Amer: 115 mL/min/{1.73_m2} (ref >59)
Globulin, Total: 2.7 g/dL (ref 1.5–4.5)
Glucose: 89 mg/dL (ref 65–99)
Potassium: 4.2 mmol/L (ref 3.5–5.2)
Sodium: 137 mmol/L (ref 134–144)
Total Protein: 7.2 g/dL (ref 6.0–8.5)

## 2019-05-03 LAB — HIV ANTIBODY (ROUTINE TESTING W REFLEX): HIV Screen 4th Generation wRfx: NONREACTIVE

## 2019-05-03 NOTE — Progress Notes (Signed)
Normal lab letter mailed.

## 2019-06-08 ENCOUNTER — Telehealth: Payer: Self-pay

## 2019-06-08 NOTE — Telephone Encounter (Signed)

## 2019-06-09 ENCOUNTER — Ambulatory Visit (INDEPENDENT_AMBULATORY_CARE_PROVIDER_SITE_OTHER): Payer: Medicaid Other | Admitting: Critical Care Medicine

## 2019-06-09 ENCOUNTER — Other Ambulatory Visit: Payer: Self-pay

## 2019-06-09 ENCOUNTER — Ambulatory Visit: Payer: Medicaid Other

## 2019-06-09 VITALS — BP 136/89 | HR 65 | Temp 97.5°F | Resp 17 | Wt 144.6 lb

## 2019-06-09 DIAGNOSIS — Z3202 Encounter for pregnancy test, result negative: Secondary | ICD-10-CM

## 2019-06-09 DIAGNOSIS — F411 Generalized anxiety disorder: Secondary | ICD-10-CM

## 2019-06-09 DIAGNOSIS — Z79899 Other long term (current) drug therapy: Secondary | ICD-10-CM

## 2019-06-09 DIAGNOSIS — Z124 Encounter for screening for malignant neoplasm of cervix: Secondary | ICD-10-CM | POA: Diagnosis not present

## 2019-06-09 DIAGNOSIS — G4700 Insomnia, unspecified: Secondary | ICD-10-CM | POA: Diagnosis not present

## 2019-06-09 LAB — POCT URINE PREGNANCY: Preg Test, Ur: NEGATIVE

## 2019-06-09 NOTE — Assessment & Plan Note (Signed)
Insomnia appears to be associating with the anxiety  Note the patient scored very high on her GAD-7 scale today  Again will refer to mental health therapy

## 2019-06-09 NOTE — Patient Instructions (Signed)
Your pregnancy test was negative  I agree with stopping BuSpar and hydroxyzine for now  Referral to gynecology was made with Dr. Clovia Cuff  Referral to psychiatry was made and also to our licensed clinical social worker Christa See for behavioral therapy  Please obtain a blood pressure cuff and monitor your blood pressure at home obtain a diary for this and call us results in a week  Return in follow-up 2 months with Dr. Margarita Rana

## 2019-06-09 NOTE — Progress Notes (Signed)
Subjective:    Patient ID: Courtney Torres, female    DOB: June 16, 1981, 38 y.o.   MRN: [id number]  This is a pleasant 38 year old female last seen September 3 by Dr. Alvis Lemmings primary care.  The patient has a history of chronic insomnia and severe generalized anxiety disorder.  This has been a lifelong complaint.  She tends to have significant obsessions ruminations difficulty sleeping difficulty getting to sleep worrying about multiple issues.  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  She has been on Prozac and Zoloft in the past with no specific side effects but has preferred to stay off these medications.  She currently is trying to get pregnant again.  She had her IUD recently removed.  At the last visit she was given a trial of BuSpar however this caused increased drowsiness and sluggishness therefore this has been stopped.  She does use Atarax in the past to help sleep however she stopped this because she is trying to get pregnant.  Mirena IUD is now been removed.  The patient has been going to [facility] for her gynecology care and she wishes to switch to [facility].  Note she asked for a pregnancy test today and it was negative   Past Medical History:  Diagnosis Date  . Abnormal Pap smear of cervix   . Anxiety      Family History  Problem Relation Age of Onset  . Hypertension Father   . Heart failure Sister   . Hypertension Brother   . Hypertension Paternal Grandfather      Social History   Socioeconomic History  . Marital status: Married    Spouse name: Not on file  . Number of children: 2  . Years of education: Not on file  . Highest education level: Not on file  Occupational History  . Not on file  Social Needs  . Financial resource strain: Not on file  . Food insecurity    Worry: Not on file    Inability: Not on file  . Transportation needs    Medical: Not on file    Non-medical: Not on file  Tobacco Use  . Smoking status: Former Smoker    Packs/day: 0.50     Types: Cigarettes  . Smokeless tobacco: Never Used  Substance and Sexual Activity  . Alcohol use: Yes    Comment: occ  . Drug use: No  . Sexual activity: Not on file  Lifestyle  . Physical activity    Days per week: Not on file    Minutes per session: Not on file  . Stress: Not on file  Relationships  . Social Musician on phone: Not on file    Gets together: Not on file    Attends religious service: Not on file    Active member of club or organization: Not on file    Attends meetings of clubs or organizations: Not on file    Relationship status: Not on file  . Intimate partner violence    Fear of current or ex partner: Not on file    Emotionally abused: Not on file    Physically abused: Not on file    Forced sexual activity: Not on file  Other Topics Concern  . Not on file  Social History Narrative  . Not on file     No Known Allergies   Outpatient Medications Prior to Visit  Medication Sig Dispense Refill  . busPIRone (BUSPAR) 7.5 MG tablet Take 1  tablet (7.5 mg total) by mouth 2 (two) times daily. 60 tablet 1  . hydrOXYzine (ATARAX/VISTARIL) 50 MG tablet TAKE 2 TABLETS BY MOUTH AT BEDTIME(REPEAT DOSE 1 TIME IF NEEDED) 180 tablet 1  . levonorgestrel (MIRENA) 20 MCG/24HR IUD by Intrauterine route.     No facility-administered medications prior to visit.       Review of Systems Constitutional:   No  weight loss, night sweats,  Fevers, chills, fatigue, lassitude. HEENT:   No headaches,  Difficulty swallowing,  Tooth/dental problems,  Sore throat,                No sneezing, itching, ear ache, nasal congestion, post nasal drip,   CV:  No chest pain,  Orthopnea, PND, swelling in lower extremities, anasarca, dizziness, palpitations  GI  No heartburn, indigestion, abdominal pain, nausea, vomiting, diarrhea, change in bowel habits, loss of appetite  Resp: No shortness of breath with exertion or at rest.  No excess mucus, no productive cough,  No  non-productive cough,  No coughing up of blood.  No change in color of mucus.  No wheezing.  No chest wall deformity  Skin: no rash or lesions.  GU: no dysuria, change in color of urine, no urgency or frequency.  No flank pain.  MS:  No joint pain or swelling.  No decreased range of motion.  No back pain.  Psych:  No change in mood or affect.  depression or anxiety.  No memory loss.     Objective:   Physical Exam Vitals:   06/09/19 1501  BP: 136/89  Pulse: 65  Resp: 17  Temp: (!) 97.5 F (36.4 C)  TempSrc: Temporal  SpO2: 100%  Weight: 144 lb 9.6 oz (65.6 kg)    Gen: Pleasant, well-nourished, in no distress, anxious affect  ENT: No lesions,  mouth clear,  oropharynx clear, no postnasal drip  Neck: No JVD, no TMG, no carotid bruits  Lungs: No use of accessory muscles, no dullness to percussion, clear without rales or rhonchi  Cardiovascular: RRR, heart sounds normal, no murmur or gallops, no peripheral edema  Abdomen: soft and NT, no HSM,  BS normal  Musculoskeletal: No deformities, no cyanosis or clubbing  Neuro: alert, non focal  Skin: Warm, no lesions or rashes  All labs from previous visit in September were reviewed      Assessment & Plan:  I personally reviewed all images and lab data in the [facility] system as well as any outside material available during this office visit and agree with the  radiology impressions.   Generalized anxiety disorder Generalized anxiety disorder in a patient who failed BuSpar and now is attempting pregnancy  We discussed the pros and cons of using SSRIs which are very clean medications have the patient wishes to hold off on pharmacotherapy until she can receive further evaluations  We will therefore refer to psychiatry and also to our licensed clinical social worker for behavioral health therapy  Insomnia Insomnia appears to be associating with the anxiety  Note the patient scored very high on her GAD-7 scale today  Again will  refer to mental health therapy  Cervical cancer screening The patient has had precancerous lesions before on her cervix and needs ongoing care also she wishes to become pregnant while her pregnancy test was negative today and her IUD recently removed she would like to reestablish with a gynecologist [place] [employer] therefore referral was made to a local gynecology practice   Makenzy was seen today for  anxiety.  Diagnoses and all orders for this visit:  Generalized anxiety disorder -     Ambulatory referral to Psychiatry  Insomnia, unspecified type -     Ambulatory referral to Psychiatry  High risk medication use -     POCT urine pregnancy  Cervical cancer screening -     Ambulatory referral to Obstetrics / Gynecology

## 2019-06-09 NOTE — Assessment & Plan Note (Signed)
Generalized anxiety disorder in a patient who failed BuSpar and now is attempting pregnancy  We discussed the pros and cons of using SSRIs which are very clean medications have the patient wishes to hold off on pharmacotherapy until she can receive further evaluations  We will therefore refer to psychiatry and also to our licensed clinical social worker for behavioral health therapy

## 2019-06-09 NOTE — Assessment & Plan Note (Signed)
The patient has had precancerous lesions before on her cervix and needs ongoing care also she wishes to become pregnant while her pregnancy test was negative today and her IUD recently removed she would like to reestablish with a gynecologist In [facility] therefore referral was made to a local gynecology practice

## 2019-06-14 ENCOUNTER — Institutional Professional Consult (permissible substitution): Payer: Medicaid Other | Admitting: Licensed Clinical Social Worker

## 2019-08-04 ENCOUNTER — Ambulatory Visit: Payer: Medicaid Other

## 2019-08-26 NOTE — L&D Delivery Note (Signed)
OB/GYN Faculty Practice Delivery Note  Donnie Gedeon is a 39 y.o. [id number] s/p VBAC at [redacted]w[redacted]d. She was admitted for IOL for PEC w SF.   ROM: 4h 69m with clear fluid GBS Status: pos, received adequate PCN   Maximum Maternal Temperature: 98  Labor Progress: . Initial SVE: 1cm. Received IP Foley balloon and cytotec x1 then transitioned to pitocin. She then progressed to complete.   Delivery Date/Time: 12/23 at 0134 Delivery: Called to room and patient was complete and pushing. Head delivered OA. No nuchal cord present. Shoulder and body delivered in usual fashion. Infant with spontaneous cry initially, placed on mother's abdomen, dried and stimulated. Cord clamped x 2 after approx 30 sec delay due to poor tone and no further crying.. Cord blood drawn. Cord gas obtained - pH 7.117. pCO2 63.6. Placenta delivered spontaneously with gentle cord traction. Fundus firm with massage and Pitocin. Labia, perineum, vagina, and cervix inspected inspected with right labial laceration, repaired..  Baby Weight: pending  Placenta: Sent to L&D Complications: None Lacerations: right labial, repaired  EBL: 55 mL Analgesia: Epidural    Infant:  APGAR (1 MIN): 3   APGAR (5 MINS): 9   APGAR (10 MINS):     Casper Harrison, MD [facility] Family Medicine Fellow, [facility] for [facility], [facility] 08/16/2020, 2:20 AM

## 2019-09-01 ENCOUNTER — Ambulatory Visit (INDEPENDENT_AMBULATORY_CARE_PROVIDER_SITE_OTHER): Payer: Medicaid Other | Admitting: Family Medicine

## 2019-09-01 DIAGNOSIS — F411 Generalized anxiety disorder: Secondary | ICD-10-CM

## 2019-09-01 DIAGNOSIS — G4700 Insomnia, unspecified: Secondary | ICD-10-CM

## 2019-09-01 NOTE — Progress Notes (Signed)
Virtual Visit via Telephone Note  I connected with Courtney Torres, on 09/01/2019 at 9:57 AM by telephone due to the COVID-19 pandemic and verified that I am speaking with the correct person using two identifiers.   Consent: I discussed the limitations, risks, security and privacy concerns of performing an evaluation and management service by telephone and the availability of in person appointments. I also discussed with the patient that there may be a patient responsible charge related to this service. The patient expressed understanding and agreed to proceed.   Location of Patient: Home  Location of Provider: Clinic   Persons participating in Telemedicine visit: Ayodele Hartsock [facility] Dr. Alvis Lemmings     History of Present Illness: Courtney Torres is a 39 year old female with a history of chronic insomnia who presents today for follow-up visit. At her last visit she was commenced on BuSpar due to complaints of anxiety symptoms which stemmed from combination of stress, remote learning for her kids.  She had not been keen on commencing SSRIs that she had tried that in the past.   Today she states she can't sleep; was initially able to on Hydroxyzine. She gets the most sleep 3 hours prior to taking her son to school. For her anxiety Buspar made her so sedated during the day but it didn't help her sleep at night and she stopped it. She feels like she has tried everything in the past and is not sure what her options are. States Xanax worked in the past and she didn't feel as sedated.  Past Medical History:  Diagnosis Date  . Abnormal Pap smear of cervix   . Anxiety    No Known Allergies  No current outpatient medications on file prior to visit.   No current facility-administered medications on file prior to visit.    Observations/Objective: Awake, alert, oriented x3 Not in acute distress  Assessment and Plan: 1. Insomnia, unspecified type Uncontrolled We have discussed the  difficult balance of treating her insomnia versus her being sedated as a side effect Advised to try clonidine at bedtime and if insomnia persists to use hydroxyzine as well - cloNIDine (CATAPRES) 0.1 MG tablet; Take 1 tablet (0.1 mg total) by mouth at bedtime. For insomnia  Dispense: 30 tablet; Refill: 3 - hydrOXYzine (ATARAX/VISTARIL) 25 MG tablet; Take 2 tablets (50 mg total) by mouth at bedtime as needed.  Dispense: 60 tablet; Refill: 3  2.  Anxiety At this time she reports being stable and declines use of BuSpar due to sedation BuSpar discontinued  Follow Up Instructions: Return for Medical conditions, keep previously scheduled appointment.    I discussed the assessment and treatment plan with the patient. The patient was provided an opportunity to ask questions and all were answered. The patient agreed with the plan and demonstrated an understanding of the instructions.   The patient was advised to call back or seek an in-person evaluation if the symptoms worsen or if the condition fails to improve as anticipated.     I provided 15 minutes total of non-face-to-face time during this encounter including median intraservice time, reviewing previous notes, labs, imaging, medications, management and patient verbalized understanding.     Hoy Register, MD, FAAFP. [facility] [place], [place] [phone]   09/01/2019, 9:57 AM

## 2019-09-02 ENCOUNTER — Encounter: Payer: Self-pay | Admitting: Family Medicine

## 2019-09-02 MED ORDER — CLONIDINE HCL 0.1 MG PO TABS: 0.1000 mg | ORAL_TABLET | Freq: Every day | ORAL | 3 refills | Status: DC

## 2019-09-02 MED ORDER — HYDROXYZINE HCL 25 MG PO TABS: 50.0000 mg | ORAL_TABLET | Freq: Every evening | ORAL | 3 refills | Status: DC | PRN

## 2020-01-12 ENCOUNTER — Other Ambulatory Visit: Payer: Self-pay

## 2020-01-12 ENCOUNTER — Ambulatory Visit (INDEPENDENT_AMBULATORY_CARE_PROVIDER_SITE_OTHER): Payer: Medicaid Other

## 2020-01-12 VITALS — BP 104/74 | HR 70 | Ht 64.0 in | Wt 150.0 lb

## 2020-01-12 DIAGNOSIS — O0990 Supervision of high risk pregnancy, unspecified, unspecified trimester: Secondary | ICD-10-CM | POA: Insufficient documentation

## 2020-01-12 DIAGNOSIS — Z3201 Encounter for pregnancy test, result positive: Secondary | ICD-10-CM | POA: Diagnosis not present

## 2020-01-12 LAB — POCT URINE PREGNANCY: Preg Test, Ur: POSITIVE — AB

## 2020-01-12 NOTE — Progress Notes (Signed)
PRENATAL INTAKE SUMMARY  Ms. Hanback presents today New OB Nurse Interview.  OB History    Gravida  3   Para  2   Term      Preterm  1   AB      Living  2     SAB      TAB      Ectopic      Multiple      Live Births  2          I have reviewed the patient's medical, obstetrical, social, and family histories, medications, and available lab results.  SUBJECTIVE She has no unusual complaints  OBJECTIVE Initial Nurse Intake (New OB)  GENERAL APPEARANCE: alert, well appearing   ASSESSMENT LMP   12/08/2019 EDD  09/13/2020  High risk pregnancy, hx of preeclampsia, preterm delivery.  PLAN Prenatal care at [facility] Already have BP Cuff at home PHQ-9=9 Prenatal labs to be done at [facility] visit. Pregnancy risks screening done [employer] App downloaded

## 2020-01-12 NOTE — Progress Notes (Signed)
Courtney Torres presents today for UPT. She has no unusual complaints.  LMP: 12/08/2019  [redacted]w[redacted]d EDD: 09/13/2020    OBJECTIVE: Appears well, in no apparent distress.  OB History    Gravida  3   Para  2   Term      Preterm  1   AB      Living  2     SAB      TAB      Ectopic      Multiple      Live Births  2          Home UPT Result:POSITIVE In-Office UPT result: POSITIVE  I have reviewed the patient's medical, obstetrical, social, and family histories, and medications.   ASSESSMENT: Positive pregnancy test  PLAN Prenatal care to be completed at: [facility]

## 2020-01-13 NOTE — Progress Notes (Signed)
Patient ID: Courtney Torres, female   DOB: 21-Jun-1981, 39 y.o.   MRN: [id number] Patient was assessed and managed by nursing staff during this encounter. I have reviewed the chart and agree with the documentation and plan. I have also made any necessary editorial changes.  Scheryl Darter, MD 01/13/2020 11:10 AM

## 2020-01-16 ENCOUNTER — Inpatient Hospital Stay (HOSPITAL_COMMUNITY)
Admission: AD | Admit: 2020-01-16 | Discharge: 2020-01-16 | Disposition: A | Discharge status: Home / Self Care | Payer: Medicaid Other | Attending: Obstetrics and Gynecology | Admitting: Obstetrics and Gynecology

## 2020-01-16 ENCOUNTER — Encounter (HOSPITAL_COMMUNITY): Payer: Self-pay | Admitting: Obstetrics and Gynecology

## 2020-01-16 ENCOUNTER — Other Ambulatory Visit: Payer: Self-pay

## 2020-01-16 DIAGNOSIS — O23591 Infection of other part of genital tract in pregnancy, first trimester: Secondary | ICD-10-CM | POA: Diagnosis not present

## 2020-01-16 DIAGNOSIS — O99891 Other specified diseases and conditions complicating pregnancy: Secondary | ICD-10-CM | POA: Diagnosis not present

## 2020-01-16 DIAGNOSIS — Z3A01 Less than 8 weeks gestation of pregnancy: Secondary | ICD-10-CM | POA: Diagnosis not present

## 2020-01-16 DIAGNOSIS — Z87891 Personal history of nicotine dependence: Secondary | ICD-10-CM | POA: Diagnosis not present

## 2020-01-16 DIAGNOSIS — N760 Acute vaginitis: Secondary | ICD-10-CM

## 2020-01-16 DIAGNOSIS — O09211 Supervision of pregnancy with history of pre-term labor, first trimester: Secondary | ICD-10-CM | POA: Diagnosis not present

## 2020-01-16 DIAGNOSIS — B9689 Other specified bacterial agents as the cause of diseases classified elsewhere: Secondary | ICD-10-CM | POA: Diagnosis not present

## 2020-01-16 LAB — URINALYSIS, ROUTINE W REFLEX MICROSCOPIC
Bilirubin Urine: NEGATIVE
Glucose, UA: NEGATIVE mg/dL
Hgb urine dipstick: NEGATIVE
Ketones, ur: NEGATIVE mg/dL
Leukocytes,Ua: NEGATIVE
Nitrite: NEGATIVE
Protein, ur: NEGATIVE mg/dL
Specific Gravity, Urine: 1.004 — ABNORMAL LOW (ref 1.005–1.030)
pH: 8 (ref 5.0–8.0)

## 2020-01-16 LAB — WET PREP, GENITAL
Sperm: NONE SEEN
Trich, Wet Prep: NONE SEEN
Yeast Wet Prep HPF POC: NONE SEEN

## 2020-01-16 MED ORDER — METRONIDAZOLE 500 MG PO TABS: 500.0000 mg | ORAL_TABLET | Freq: Two times a day (BID) | ORAL | 0 refills | Status: AC

## 2020-01-16 NOTE — Progress Notes (Signed)
Speculum exam by NP

## 2020-01-16 NOTE — Discharge Instructions (Signed)

## 2020-01-16 NOTE — MAU Provider Note (Signed)
History     CSN: [id number]  Arrival date and time: 01/16/20 2694   First Provider Initiated Contact with Patient 01/16/20 0910      No chief complaint on file.  HPI   Ms.Courtney Torres is a 39 y.o. female [phone] @ [redacted]w[redacted]d here in MAU with complaints of bacterial vaginosis/ fishy odor to discharge. She has not other complaints. States she saw brownish discharge 2 days ago and she never saw it again. No pain. Had BV one time in the past but nothing frequent. [facility].   OB History    Gravida  3   Para  2   Term      Preterm  1   AB      Living  2     SAB      TAB      Ectopic      Multiple      Live Births  2           Past Medical History:  Diagnosis Date  . Abnormal Pap smear of cervix   . Anxiety   . Depression   . Pregnancy induced hypertension   . Preterm labor     Past Surgical History:  Procedure Laterality Date  . CERVICAL BIOPSY  W/ LOOP ELECTRODE EXCISION    . CESAREAN SECTION    . COLPOSCOPY    . TONSILLECTOMY AND ADENOIDECTOMY      Family History  Problem Relation Age of Onset  . Hypertension Father   . Arthritis Father   . Asthma Father   . COPD Father   . Heart failure Sister   . Early death Sister   . Heart disease Sister   . Hypertension Brother   . Alcohol abuse Brother   . Drug abuse Brother   . Hypertension Paternal Grandfather   . Anxiety disorder Mother   . Arthritis Mother   . Depression Mother     Social History   Tobacco Use  . Smoking status: Former Smoker    Packs/day: 0.50    Types: Cigarettes  . Smokeless tobacco: Never Used  Substance Use Topics  . Alcohol use: Not Currently    Comment: occ  . Drug use: No    Allergies: No Known Allergies  Medications Prior to Admission  Medication Sig Dispense Refill Last Dose  . Prenatal Vit-Fe Fumarate-FA (PRENATAL MULTIVITAMIN) TABS tablet Take 1 tablet by mouth daily at 12 noon.   01/15/2020 at Unknown time   . cloNIDine (CATAPRES) 0.1 MG tablet Take 1 tablet (0.1 mg total) by mouth at bedtime. For insomnia (Patient not taking: Reported on 01/12/2020) 30 tablet 3   . hydrOXYzine (ATARAX/VISTARIL) 25 MG tablet Take 2 tablets (50 mg total) by mouth at bedtime as needed. (Patient not taking: Reported on 01/12/2020) 60 tablet 3    Results for orders placed or performed during the hospital encounter of 01/16/20 (from the past 48 hour(s))  Wet prep, genital     Status: Abnormal   Collection Time: 01/16/20  8:36 AM   Specimen: Vaginal  Result Value Ref Range   Yeast Wet Prep HPF POC NONE SEEN NONE SEEN   Trich, Wet Prep NONE SEEN NONE SEEN   Clue Cells Wet Prep HPF POC PRESENT (A) NONE SEEN   WBC, Wet Prep HPF POC MODERATE (A) NONE SEEN   Sperm NONE SEEN     Comment: Performed at [facility], [address] [place], [place] [address]  Urinalysis, Routine w reflex microscopic     Status: Abnormal   Collection Time: 01/16/20  9:01 AM  Result Value Ref Range   Color, Urine STRAW (A) YELLOW   APPearance CLEAR CLEAR   Specific Gravity, Urine 1.004 (L) 1.005 - 1.030   pH 8.0 5.0 - 8.0   Glucose, UA NEGATIVE NEGATIVE mg/dL   Hgb urine dipstick NEGATIVE NEGATIVE   Bilirubin Urine NEGATIVE NEGATIVE   Ketones, ur NEGATIVE NEGATIVE mg/dL   Protein, ur NEGATIVE NEGATIVE mg/dL   Nitrite NEGATIVE NEGATIVE   Leukocytes,Ua NEGATIVE NEGATIVE    Comment: Performed at [facility] [address] [place], [place] [address]   Review of Systems  Constitutional: Negative for fever.  Gastrointestinal: Negative for abdominal pain.  Genitourinary: Positive for vaginal discharge. Negative for dysuria, frequency and vaginal bleeding.   Physical Exam   Blood pressure 131/87, pulse 79, temperature 98.5 F (36.9 C), temperature source Oral, resp. rate 17, weight 69.2 kg, last menstrual period 12/08/2019, SpO2 100 %.  Physical Exam  Constitutional: She appears well-developed and well-nourished.   Genitourinary:    Genitourinary Comments: Vagina - Small amount of white vaginal discharge, + odor  Cervix - No contact bleeding, no active bleeding  Bimanual exam: Cervix closed Uterus non tender, normal size Adnexa non tender, no masses bilaterally GC/Chlam, wet prep done Chaperone present for exam.    Skin: She is not diaphoretic.   MAU Course  Procedures  None  MDM  Patient without pain or bleeding at this time. Wet prep consistent with BV.  Assessment and Plan   A:  1. Bacterial vaginosis   2. [redacted] weeks gestation of pregnancy     P:  Discharge home in stable condition Rx: Flagyl Return to MAU with any pain or bleeding Start prenatal care Prenatal vitamins daily   Naithan Delage, Artist Pais, NP 01/17/2020 7:39 AM

## 2020-01-16 NOTE — MAU Note (Signed)
Pt states she thinks she might have BV, that she has had some discharge with an odor. Reports vaginal spotting two days ago and once yesterday that was  A "pinkish, brownish color". Denies pain.

## 2020-01-17 LAB — GC/CHLAMYDIA PROBE AMP (~~LOC~~) NOT AT ARMC
Chlamydia: NEGATIVE
Comment: NEGATIVE
Comment: NORMAL
Neisseria Gonorrhea: NEGATIVE

## 2020-02-17 ENCOUNTER — Encounter: Payer: Self-pay | Admitting: Obstetrics & Gynecology

## 2020-02-17 ENCOUNTER — Ambulatory Visit (INDEPENDENT_AMBULATORY_CARE_PROVIDER_SITE_OTHER): Payer: Medicaid Other

## 2020-02-17 ENCOUNTER — Other Ambulatory Visit: Payer: Self-pay

## 2020-02-17 ENCOUNTER — Other Ambulatory Visit (HOSPITAL_COMMUNITY)
Admission: RE | Admit: 2020-02-17 | Discharge: 2020-02-17 | Disposition: A | Discharge status: Home / Self Care | Payer: Medicaid Other | Source: Ambulatory Visit | Attending: Obstetrics & Gynecology | Admitting: Obstetrics & Gynecology

## 2020-02-17 ENCOUNTER — Ambulatory Visit (INDEPENDENT_AMBULATORY_CARE_PROVIDER_SITE_OTHER): Payer: Medicaid Other | Admitting: Obstetrics & Gynecology

## 2020-02-17 VITALS — BP 121/82 | HR 72 | Wt 156.0 lb

## 2020-02-17 DIAGNOSIS — O3680X0 Pregnancy with inconclusive fetal viability, not applicable or unspecified: Secondary | ICD-10-CM | POA: Diagnosis not present

## 2020-02-17 DIAGNOSIS — Z3A10 10 weeks gestation of pregnancy: Secondary | ICD-10-CM

## 2020-02-17 DIAGNOSIS — O0990 Supervision of high risk pregnancy, unspecified, unspecified trimester: Secondary | ICD-10-CM | POA: Diagnosis present

## 2020-02-17 DIAGNOSIS — O0991 Supervision of high risk pregnancy, unspecified, first trimester: Secondary | ICD-10-CM | POA: Diagnosis not present

## 2020-02-17 DIAGNOSIS — O09291 Supervision of pregnancy with other poor reproductive or obstetric history, first trimester: Secondary | ICD-10-CM | POA: Diagnosis not present

## 2020-02-17 DIAGNOSIS — O09899 Supervision of other high risk pregnancies, unspecified trimester: Secondary | ICD-10-CM | POA: Insufficient documentation

## 2020-02-17 DIAGNOSIS — Z98891 History of uterine scar from previous surgery: Secondary | ICD-10-CM

## 2020-02-17 DIAGNOSIS — O09891 Supervision of other high risk pregnancies, first trimester: Secondary | ICD-10-CM

## 2020-02-17 DIAGNOSIS — O09299 Supervision of pregnancy with other poor reproductive or obstetric history, unspecified trimester: Secondary | ICD-10-CM | POA: Insufficient documentation

## 2020-02-17 MED ORDER — ASPIRIN EC 81 MG PO TBEC: 81.0000 mg | DELAYED_RELEASE_TABLET | Freq: Every day | ORAL | 11 refills | Status: DC

## 2020-02-17 NOTE — Progress Notes (Signed)
Subjective:US done today    Courtney Torres is a [id number] [redacted]w[redacted]d being seen today for her first obstetrical visit.  Her obstetrical history is significant for advanced maternal age and h/o preeclamsia, CS, preterm birth. Patient does not intend to breast feed. Pregnancy history fully reviewed.  Patient reports nausea.  Vitals:   02/17/20 1021  BP: 121/82  Pulse: 72  Weight: 156 lb (70.8 kg)    HISTORY: OB History  Gravida Para Term Preterm AB Living  3 2   2   2   SAB TAB Ectopic Multiple Live Births          2    # Outcome Date GA Lbr Len/2nd Weight Sex Delivery Anes PTL Lv  3 Current           2 Preterm 01/04/13 [redacted]w[redacted]d  4 lb 10 oz (2.098 kg) M Vag-Spont   LIV  1 Preterm 05/06/02 [redacted]w[redacted]d  7 lb 14 oz (3.572 kg) F CS-LTranv  Y LIV   Past Medical History:  Diagnosis Date  . Abnormal Pap smear of cervix   . Anxiety   . Depression   . Pregnancy induced hypertension   . Preterm labor    Past Surgical History:  Procedure Laterality Date  . CERVICAL BIOPSY  W/ LOOP ELECTRODE EXCISION    . CESAREAN SECTION    . COLPOSCOPY    . TONSILLECTOMY AND ADENOIDECTOMY     Family History  Problem Relation Age of Onset  . Hypertension Father   . Arthritis Father   . Asthma Father   . COPD Father   . Heart failure Sister   . Early death Sister   . Heart disease Sister   . Hypertension Brother   . Alcohol abuse Brother   . Drug abuse Brother   . Hypertension Paternal Grandfather   . Anxiety disorder Mother   . Arthritis Mother   . Depression Mother      Exam    Uterus:     Pelvic Exam:    Perineum: No Hemorrhoids   Vulva: normal   Vagina:  normal mucosa   pH:     Cervix: no lesions   Adnexa: normal adnexa   Bony Pelvis: average  System: Breast:  normal appearance, no masses or tenderness   Skin: normal coloration and turgor, no rashes    Neurologic: oriented, normal mood   Extremities: normal strength, tone, and muscle mass   HEENT PERRLA, neck supple with midline  trachea and thyroid without masses   Mouth/Teeth dental hygiene good   Neck supple   Cardiovascular: regular rate and rhythm   Respiratory:  appears well, vitals normal, no respiratory distress, acyanotic, normal RR, neck free of mass or lymphadenopathy   Abdomen: soft, non-tender; bowel sounds normal; no masses,  no organomegaly   Urinary: urethral meatus normal      Assessment:    Pregnancy: [redacted]w[redacted]d Patient Active Problem List   Diagnosis Date Noted  . Hx of preeclampsia, prior pregnancy, currently pregnant, unspecified trimester 02/17/2020  . History of cesarean section 02/17/2020  . Supervision of high risk pregnancy, antepartum 01/12/2020  . Generalized anxiety disorder 06/09/2019  . Insomnia 06/09/2019        Plan:     Initial labs drawn. Prenatal vitamins. Problem list reviewed and updated. Genetic Screening discussed [employer] and [employer]  Ultrasound discussed; fetal survey: ordered.  Follow up in 6 weeks. 50% of 30 min visit spent on counseling and coordination of care.  ASA 81mg  daily  Emeterio Reeve 02/17/2020

## 2020-02-17 NOTE — Patient Instructions (Signed)

## 2020-02-17 NOTE — Progress Notes (Signed)
NOB  Panned Pregnancy :Yes  Intake Done on 01/12/20   Genetic Screening: Desires pt also wants to know gender.    Last pap: 10/26/2018  WNL   *2014 pregnancy pt delivered 7 wks early.   CC: None

## 2020-02-18 LAB — CBC/D/PLT+RPR+RH+ABO+RUB AB...
Antibody Screen: NEGATIVE
Basophils Absolute: 0.1 10*3/uL (ref 0.0–0.2)
Basos: 1 %
EOS (ABSOLUTE): 0.2 10*3/uL (ref 0.0–0.4)
Eos: 2 %
HCV Ab: <0.1 s/co ratio (ref 0.0–0.9)
HIV Screen 4th Generation wRfx: NONREACTIVE
Hematocrit: 39.5 % (ref 34.0–46.6)
Hemoglobin: 12.8 g/dL (ref 11.1–15.9)
Hepatitis B Surface Ag: NEGATIVE
Immature Grans (Abs): 0.1 10*3/uL (ref 0.0–0.1)
Immature Granulocytes: 1 %
Lymphocytes Absolute: 2.1 10*3/uL (ref 0.7–3.1)
Lymphs: 23 %
MCH: 30.8 pg (ref 26.6–33.0)
MCHC: 32.4 g/dL (ref 31.5–35.7)
MCV: 95 fL (ref 79–97)
Monocytes Absolute: 0.5 10*3/uL (ref 0.1–0.9)
Monocytes: 6 %
Neutrophils Absolute: 6 10*3/uL (ref 1.4–7.0)
Neutrophils: 67 %
Platelets: 295 10*3/uL (ref 150–450)
RBC: 4.16 x10E6/uL (ref 3.77–5.28)
RDW: 12.4 % (ref 11.7–15.4)
RPR Ser Ql: NONREACTIVE
Rh Factor: POSITIVE
Rubella Antibodies, IGG: 4.7 index (ref >0.99)
WBC: 8.9 10*3/uL (ref 3.4–10.8)

## 2020-02-18 LAB — HCV INTERPRETATION

## 2020-02-21 LAB — URINE CULTURE, OB REFLEX

## 2020-02-21 LAB — CULTURE, OB URINE

## 2020-02-23 LAB — CERVICOVAGINAL ANCILLARY ONLY
Bacterial Vaginitis (gardnerella): NEGATIVE
Candida Glabrata: NEGATIVE
Candida Vaginitis: NEGATIVE
Chlamydia: NEGATIVE
Comment: NEGATIVE
Comment: NEGATIVE
Comment: NEGATIVE
Comment: NEGATIVE
Comment: NEGATIVE
Comment: NORMAL
Neisseria Gonorrhea: NEGATIVE
Trichomonas: NEGATIVE

## 2020-02-24 ENCOUNTER — Encounter: Payer: Self-pay | Admitting: Obstetrics and Gynecology

## 2020-02-29 ENCOUNTER — Encounter: Payer: Self-pay | Admitting: Obstetrics and Gynecology

## 2020-02-29 ENCOUNTER — Encounter: Payer: Self-pay | Admitting: Obstetrics & Gynecology

## 2020-02-29 DIAGNOSIS — Z141 Cystic fibrosis carrier: Secondary | ICD-10-CM | POA: Insufficient documentation

## 2020-02-29 HISTORY — DX: Cystic fibrosis carrier: Z14.1

## 2020-03-01 ENCOUNTER — Encounter: Payer: Self-pay | Admitting: Obstetrics and Gynecology

## 2020-03-01 ENCOUNTER — Telehealth: Payer: Self-pay

## 2020-03-01 NOTE — Telephone Encounter (Signed)
Called to advise of results, no answer, left vm ?

## 2020-03-30 ENCOUNTER — Other Ambulatory Visit: Payer: Self-pay

## 2020-03-30 ENCOUNTER — Ambulatory Visit (INDEPENDENT_AMBULATORY_CARE_PROVIDER_SITE_OTHER): Payer: Medicaid Other | Admitting: Obstetrics and Gynecology

## 2020-03-30 VITALS — BP 120/80 | HR 70 | Wt 161.1 lb

## 2020-03-30 DIAGNOSIS — Z98891 History of uterine scar from previous surgery: Secondary | ICD-10-CM

## 2020-03-30 DIAGNOSIS — O0990 Supervision of high risk pregnancy, unspecified, unspecified trimester: Secondary | ICD-10-CM

## 2020-03-30 DIAGNOSIS — O09299 Supervision of pregnancy with other poor reproductive or obstetric history, unspecified trimester: Secondary | ICD-10-CM

## 2020-03-30 DIAGNOSIS — Z141 Cystic fibrosis carrier: Secondary | ICD-10-CM

## 2020-03-30 DIAGNOSIS — Z3A16 16 weeks gestation of pregnancy: Secondary | ICD-10-CM | POA: Insufficient documentation

## 2020-03-30 DIAGNOSIS — O3442 Maternal care for other abnormalities of cervix, second trimester: Secondary | ICD-10-CM

## 2020-03-30 DIAGNOSIS — Z9889 Other specified postprocedural states: Secondary | ICD-10-CM

## 2020-03-30 DIAGNOSIS — O09899 Supervision of other high risk pregnancies, unspecified trimester: Secondary | ICD-10-CM

## 2020-03-30 DIAGNOSIS — F411 Generalized anxiety disorder: Secondary | ICD-10-CM

## 2020-03-30 NOTE — Progress Notes (Signed)
   PRENATAL VISIT NOTE  Subjective:  Courtney Torres is a 39 y.o. [id number] at [redacted]w[redacted]d being seen today for ongoing prenatal care.  She is currently monitored for the following issues for this high-risk pregnancy and has Generalized anxiety disorder; Insomnia; Supervision of high risk pregnancy, antepartum; Hx of preeclampsia, prior pregnancy, currently pregnant, unspecified trimester; History of cesarean section; History of preterm delivery, currently pregnant; Cystic fibrosis carrier; [redacted] weeks gestation of pregnancy; and History of LEEP (loop electrosurgical excision procedure) of cervix complicating pregnancy in second trimester on their problem list.  Patient doing well with no acute concerns today. She reports no complaints.   .  .  Movement: Present. Denies leaking of fluid.   Discussed pt's history.  She had c/s first pregnancy due to preeclampsia at 36 weeks, but then had true preterm delivery at 33 weeks with cervix dilated and BBOW.  Per pt she did have contractions and delivered within a few hours.    This was a VBAC delivery.  Discussed makena with patient and pt does desire to try this intervention.     The following portions of the patient's history were reviewed and updated as appropriate: allergies, current medications, past family history, past medical history, past social history, past surgical history and problem list. Problem list updated.  Objective:   Vitals:   03/30/20 1119  BP: 120/80  Pulse: 70  Weight: 161 lb 1.6 oz (73.1 kg)    Fetal Status: Fetal Heart Rate (bpm): 160   Movement: Present     General:  Alert, oriented and cooperative. Patient is in no acute distress.  Skin: Skin is warm and dry. No rash noted.   Cardiovascular: Normal heart rate noted  Respiratory: Normal respiratory effort, no problems with respiration noted  Abdomen: Soft, gravid, appropriate for gestational age.  Pain/Pressure: Absent     Pelvic: Cervical exam deferred        Extremities: Normal  range of motion.  Edema: None  Mental Status:  Normal mood and affect. Normal behavior. Normal judgment and thought content.   Assessment and Plan:  Pregnancy: [id number] at [redacted]w[redacted]d  1. Generalized anxiety disorder   2. Supervision of high risk pregnancy, antepartum  - AFP, Serum, Open Spina Bifida  3. Hx of preeclampsia, prior pregnancy, currently pregnant, unspecified trimester Pt continues with baby ASA  4. History of cesarean section Pt desires TOLAC  5. History of preterm delivery, currently pregnant After long discussion, pt will trial makena, suggest cervical length at anatomy scan  6. Cystic fibrosis carrier   7. [redacted] weeks gestation of pregnancy   8. History of LEEP (loop electrosurgical excision procedure) of cervix complicating pregnancy in second trimester Cervical length at anatomy scan  Preterm labor symptoms and general obstetric precautions including but not limited to vaginal bleeding, contractions, leaking of fluid and fetal movement were reviewed in detail with the patient.  Please refer to After Visit Summary for other counseling recommendations.   Return in about 4 weeks (around 04/27/2020) for [facility], in person.   Mariel Aloe, MD

## 2020-04-01 LAB — AFP, SERUM, OPEN SPINA BIFIDA
AFP MoM: 1.05
AFP Value: 34.9 ng/mL
Gest. Age on Collection Date: 16.1 weeks
Maternal Age At EDD: 37.7 yr
OSBR Risk 1 IN: 10000
Test Results:: NEGATIVE
Weight: 161 [lb_av]

## 2020-04-20 ENCOUNTER — Ambulatory Visit: Payer: Medicaid Other | Admitting: *Deleted

## 2020-04-20 ENCOUNTER — Other Ambulatory Visit: Payer: Self-pay | Admitting: *Deleted

## 2020-04-20 ENCOUNTER — Other Ambulatory Visit: Payer: Self-pay

## 2020-04-20 ENCOUNTER — Ambulatory Visit: Payer: Medicaid Other | Attending: Obstetrics and Gynecology

## 2020-04-20 VITALS — BP 131/80 | HR 82

## 2020-04-20 DIAGNOSIS — O09212 Supervision of pregnancy with history of pre-term labor, second trimester: Secondary | ICD-10-CM | POA: Diagnosis not present

## 2020-04-20 DIAGNOSIS — O09529 Supervision of elderly multigravida, unspecified trimester: Secondary | ICD-10-CM | POA: Insufficient documentation

## 2020-04-20 DIAGNOSIS — O09522 Supervision of elderly multigravida, second trimester: Secondary | ICD-10-CM | POA: Diagnosis not present

## 2020-04-20 DIAGNOSIS — O0990 Supervision of high risk pregnancy, unspecified, unspecified trimester: Secondary | ICD-10-CM | POA: Diagnosis not present

## 2020-04-20 DIAGNOSIS — O09892 Supervision of other high risk pregnancies, second trimester: Secondary | ICD-10-CM

## 2020-04-20 DIAGNOSIS — Z363 Encounter for antenatal screening for malformations: Secondary | ICD-10-CM

## 2020-04-20 DIAGNOSIS — O3442 Maternal care for other abnormalities of cervix, second trimester: Secondary | ICD-10-CM

## 2020-04-20 DIAGNOSIS — O09292 Supervision of pregnancy with other poor reproductive or obstetric history, second trimester: Secondary | ICD-10-CM

## 2020-04-20 DIAGNOSIS — Z3A19 19 weeks gestation of pregnancy: Secondary | ICD-10-CM

## 2020-04-20 DIAGNOSIS — O99342 Other mental disorders complicating pregnancy, second trimester: Secondary | ICD-10-CM

## 2020-04-20 DIAGNOSIS — F99 Mental disorder, not otherwise specified: Secondary | ICD-10-CM

## 2020-04-20 DIAGNOSIS — O34219 Maternal care for unspecified type scar from previous cesarean delivery: Secondary | ICD-10-CM

## 2020-04-27 ENCOUNTER — Ambulatory Visit (INDEPENDENT_AMBULATORY_CARE_PROVIDER_SITE_OTHER): Payer: Medicaid Other | Admitting: Obstetrics and Gynecology

## 2020-04-27 ENCOUNTER — Encounter: Payer: Self-pay | Admitting: Obstetrics and Gynecology

## 2020-04-27 ENCOUNTER — Other Ambulatory Visit: Payer: Self-pay

## 2020-04-27 VITALS — BP 125/80 | HR 99 | Wt 171.0 lb

## 2020-04-27 DIAGNOSIS — Z98891 History of uterine scar from previous surgery: Secondary | ICD-10-CM

## 2020-04-27 DIAGNOSIS — Z9889 Other specified postprocedural states: Secondary | ICD-10-CM

## 2020-04-27 DIAGNOSIS — O3442 Maternal care for other abnormalities of cervix, second trimester: Secondary | ICD-10-CM

## 2020-04-27 DIAGNOSIS — O0990 Supervision of high risk pregnancy, unspecified, unspecified trimester: Secondary | ICD-10-CM

## 2020-04-27 DIAGNOSIS — O09899 Supervision of other high risk pregnancies, unspecified trimester: Secondary | ICD-10-CM

## 2020-04-27 DIAGNOSIS — O09299 Supervision of pregnancy with other poor reproductive or obstetric history, unspecified trimester: Secondary | ICD-10-CM

## 2020-04-27 DIAGNOSIS — Z3009 Encounter for other general counseling and advice on contraception: Secondary | ICD-10-CM | POA: Insufficient documentation

## 2020-04-27 DIAGNOSIS — Z141 Cystic fibrosis carrier: Secondary | ICD-10-CM

## 2020-04-27 NOTE — Progress Notes (Signed)
Pt is here for ROB, [redacted]w[redacted]d.

## 2020-04-27 NOTE — Patient Instructions (Signed)

## 2020-04-27 NOTE — Progress Notes (Signed)
Subjective:  Analiza Cowger is a 39 y.o. L3Y1017 at [redacted]w[redacted]d being seen today for ongoing prenatal care.  She is currently monitored for the following issues for this high-risk pregnancy and has Generalized anxiety disorder; Insomnia; Supervision of high risk pregnancy, antepartum; Hx of preeclampsia, prior pregnancy, currently pregnant, unspecified trimester; History of cesarean section; History of preterm delivery, currently pregnant; Cystic fibrosis carrier; History of LEEP (loop electrosurgical excision procedure) of cervix complicating pregnancy in second trimester; and Unwanted fertility on their problem list.  Patient reports no complaints.  Contractions: Not present. Vag. Bleeding: None.  Movement: Present. Denies leaking of fluid.   The following portions of the patient's history were reviewed and updated as appropriate: allergies, current medications, past family history, past medical history, past social history, past surgical history and problem list. Problem list updated.  Objective:   Vitals:   04/27/20 0924  BP: 125/80  Pulse: 99  Weight: 171 lb (77.6 kg)    Fetal Status: Fetal Heart Rate (bpm): 160   Movement: Present     General:  Alert, oriented and cooperative. Patient is in no acute distress.  Skin: Skin is warm and dry. No rash noted.   Cardiovascular: Normal heart rate noted  Respiratory: Normal respiratory effort, no problems with respiration noted  Abdomen: Soft, gravid, appropriate for gestational age. Pain/Pressure: Absent     Pelvic:  Cervical exam deferred        Extremities: Normal range of motion.  Edema: None  Mental Status: Normal mood and affect. Normal behavior. Normal judgment and thought content.   Urinalysis:      Assessment and Plan:  Pregnancy: [id number] at [redacted]w[redacted]d  1. Supervision of high risk pregnancy, antepartum Stable F/U growth scan next week  2. History of LEEP (loop electrosurgical excision procedure) of cervix complicating pregnancy in second  trimester Normal CL at anatomy scan  3. Cystic fibrosis carrier S/P genetic counseling  4. History of preterm delivery, currently pregnant Continue with weekly 17 OHP  5. History of cesarean section Desires TOLAC Will need to discuss at later [facility] visits and obtain consent  6. Hx of preeclampsia, prior pregnancy, currently pregnant, unspecified trimester BP stable Continue with qd BASA  7. Unwanted fertility Will need to sign BTL papers  @ 28-30 weeks  Preterm labor symptoms and general obstetric precautions including but not limited to vaginal bleeding, contractions, leaking of fluid and fetal movement were reviewed in detail with the patient. Please refer to After Visit Summary for other counseling recommendations.  Return in about 4 weeks (around 05/25/2020) for OB visit, face to face, MD only.   Hermina Staggers, MD

## 2020-05-01 ENCOUNTER — Ambulatory Visit: Payer: Medicaid Other | Admitting: *Deleted

## 2020-05-01 ENCOUNTER — Encounter: Payer: Self-pay | Admitting: *Deleted

## 2020-05-01 ENCOUNTER — Ambulatory Visit: Payer: Medicaid Other | Attending: Obstetrics and Gynecology

## 2020-05-01 ENCOUNTER — Other Ambulatory Visit: Payer: Self-pay

## 2020-05-01 VITALS — BP 121/82 | HR 94

## 2020-05-01 DIAGNOSIS — O09212 Supervision of pregnancy with history of pre-term labor, second trimester: Secondary | ICD-10-CM | POA: Diagnosis not present

## 2020-05-01 DIAGNOSIS — Z3A20 20 weeks gestation of pregnancy: Secondary | ICD-10-CM

## 2020-05-01 DIAGNOSIS — O09292 Supervision of pregnancy with other poor reproductive or obstetric history, second trimester: Secondary | ICD-10-CM

## 2020-05-01 DIAGNOSIS — O09529 Supervision of elderly multigravida, unspecified trimester: Secondary | ICD-10-CM | POA: Diagnosis not present

## 2020-05-01 DIAGNOSIS — Z3686 Encounter for antenatal screening for cervical length: Secondary | ICD-10-CM

## 2020-05-01 DIAGNOSIS — F329 Major depressive disorder, single episode, unspecified: Secondary | ICD-10-CM

## 2020-05-01 DIAGNOSIS — O09522 Supervision of elderly multigravida, second trimester: Secondary | ICD-10-CM | POA: Diagnosis not present

## 2020-05-01 DIAGNOSIS — Z141 Cystic fibrosis carrier: Secondary | ICD-10-CM

## 2020-05-01 DIAGNOSIS — O3442 Maternal care for other abnormalities of cervix, second trimester: Secondary | ICD-10-CM | POA: Diagnosis not present

## 2020-05-01 DIAGNOSIS — O99342 Other mental disorders complicating pregnancy, second trimester: Secondary | ICD-10-CM

## 2020-05-01 DIAGNOSIS — O34219 Maternal care for unspecified type scar from previous cesarean delivery: Secondary | ICD-10-CM

## 2020-05-25 ENCOUNTER — Ambulatory Visit (INDEPENDENT_AMBULATORY_CARE_PROVIDER_SITE_OTHER): Payer: Medicaid Other | Admitting: Family Medicine

## 2020-05-25 ENCOUNTER — Other Ambulatory Visit: Payer: Self-pay

## 2020-05-25 VITALS — BP 113/77 | HR 83 | Wt 178.0 lb

## 2020-05-25 DIAGNOSIS — Z141 Cystic fibrosis carrier: Secondary | ICD-10-CM

## 2020-05-25 DIAGNOSIS — Z23 Encounter for immunization: Secondary | ICD-10-CM

## 2020-05-25 DIAGNOSIS — Z98891 History of uterine scar from previous surgery: Secondary | ICD-10-CM

## 2020-05-25 DIAGNOSIS — O09899 Supervision of other high risk pregnancies, unspecified trimester: Secondary | ICD-10-CM

## 2020-05-25 DIAGNOSIS — O0990 Supervision of high risk pregnancy, unspecified, unspecified trimester: Secondary | ICD-10-CM

## 2020-05-25 DIAGNOSIS — O3442 Maternal care for other abnormalities of cervix, second trimester: Secondary | ICD-10-CM

## 2020-05-25 DIAGNOSIS — Z9889 Other specified postprocedural states: Secondary | ICD-10-CM

## 2020-05-25 NOTE — Patient Instructions (Signed)

## 2020-05-25 NOTE — Progress Notes (Signed)
   PRENATAL VISIT NOTE  Subjective:  Courtney Torres is a 39 y.o. [id number] at [redacted]w[redacted]d being seen today for ongoing prenatal care.  She is currently monitored for the following issues for this high-risk pregnancy and has Generalized anxiety disorder; Insomnia; Supervision of high risk pregnancy, antepartum; Hx of preeclampsia, prior pregnancy, currently pregnant, unspecified trimester; History of cesarean section; History of preterm delivery, currently pregnant; Cystic fibrosis carrier; History of LEEP (loop electrosurgical excision procedure) of cervix complicating pregnancy in second trimester; and Unwanted fertility on their problem list.  Patient reports no complaints.  Contractions: Not present. Vag. Bleeding: None.  Movement: Present. Denies leaking of fluid.   The following portions of the patient's history were reviewed and updated as appropriate: allergies, current medications, past family history, past medical history, past social history, past surgical history and problem list.   Objective:   Vitals:   05/25/20 0940  BP: 113/77  Pulse: 83  Weight: 178 lb (80.7 kg)    Fetal Status: Fetal Heart Rate (bpm): 159   Movement: Present     General:  Alert, oriented and cooperative. Patient is in no acute distress.  Skin: Skin is warm and dry. No rash noted.   Cardiovascular: Normal heart rate noted  Respiratory: Normal respiratory effort, no problems with respiration noted  Abdomen: Soft, gravid, appropriate for gestational age.  Pain/Pressure: Absent     Pelvic: Cervical exam deferred        Extremities: Normal range of motion.  Edema: Trace  Mental Status: Normal mood and affect. Normal behavior. Normal judgment and thought content.   Assessment and Plan:  Pregnancy: [id number] at [redacted]w[redacted]d 1. Cystic fibrosis carrier   2. History of cesarean section Desires TOLAC, needs consent at next visit  3. History of LEEP (loop electrosurgical excision procedure) of cervix complicating pregnancy in  second trimester Likely partially why she as h/o PTB  4. History of preterm delivery, currently pregnant On Makena, has some swelling/itching related to this but will continue weekly injections  5. Supervision of high risk pregnancy, antepartum 28 wk labs next visit. Flu vaccine today  6. COVID-19 Vaccine Counseling: The patient was counseled on the potential benefits and lack of known risks of COVID vaccination, during pregnancy and breastfeeding, during today's visit. The patient's questions and concerns were addressed today, including safety of the vaccination and potential side effects as they have been published by ACOG and [facility]. The patient has been informed that there have not been any documented vaccine related injuries, deaths or birth defects to infant or mom after receiving the COVID-19 vaccine to date. The patient has been made aware that although she is not at increased risk of contracting COVID-19 during pregnancy, she is at increased risk of developing severe disease and complications if she contracts COVID-19 while pregnant. All patient questions were addressed during our visit today. The patient is still unsure of her decision for vaccination.    Preterm labor symptoms and general obstetric precautions including but not limited to vaginal bleeding, contractions, leaking of fluid and fetal movement were reviewed in detail with the patient. Please refer to After Visit Summary for other counseling recommendations.   Return in 4 weeks (on 06/22/2020) for in person, 28 wk labs, 17 P weekly.  Future Appointments  Date Time Provider Department Center  06/18/2020  9:30 AM [facility] NURSE [facility]  06/18/2020  9:45 AM [facility]    Reva Bores, MD

## 2020-06-01 ENCOUNTER — Ambulatory Visit: Payer: Medicaid Other

## 2020-06-18 ENCOUNTER — Ambulatory Visit: Payer: Medicaid Other | Admitting: *Deleted

## 2020-06-18 ENCOUNTER — Other Ambulatory Visit: Payer: Self-pay

## 2020-06-18 ENCOUNTER — Ambulatory Visit: Payer: Medicaid Other | Attending: Obstetrics and Gynecology

## 2020-06-18 VITALS — BP 134/84 | HR 102

## 2020-06-18 DIAGNOSIS — Z3A27 27 weeks gestation of pregnancy: Secondary | ICD-10-CM

## 2020-06-18 DIAGNOSIS — O09529 Supervision of elderly multigravida, unspecified trimester: Secondary | ICD-10-CM | POA: Diagnosis present

## 2020-06-18 DIAGNOSIS — O34219 Maternal care for unspecified type scar from previous cesarean delivery: Secondary | ICD-10-CM | POA: Diagnosis not present

## 2020-06-18 DIAGNOSIS — O3442 Maternal care for other abnormalities of cervix, second trimester: Secondary | ICD-10-CM

## 2020-06-18 DIAGNOSIS — F329 Major depressive disorder, single episode, unspecified: Secondary | ICD-10-CM

## 2020-06-18 DIAGNOSIS — O09522 Supervision of elderly multigravida, second trimester: Secondary | ICD-10-CM | POA: Diagnosis not present

## 2020-06-18 DIAGNOSIS — O09212 Supervision of pregnancy with history of pre-term labor, second trimester: Secondary | ICD-10-CM

## 2020-06-18 DIAGNOSIS — Z141 Cystic fibrosis carrier: Secondary | ICD-10-CM

## 2020-06-18 DIAGNOSIS — O09892 Supervision of other high risk pregnancies, second trimester: Secondary | ICD-10-CM

## 2020-06-18 DIAGNOSIS — O99342 Other mental disorders complicating pregnancy, second trimester: Secondary | ICD-10-CM

## 2020-06-22 ENCOUNTER — Other Ambulatory Visit: Payer: Medicaid Other

## 2020-06-22 ENCOUNTER — Other Ambulatory Visit: Payer: Self-pay

## 2020-06-22 ENCOUNTER — Encounter: Payer: Self-pay | Admitting: Obstetrics and Gynecology

## 2020-06-22 ENCOUNTER — Ambulatory Visit (INDEPENDENT_AMBULATORY_CARE_PROVIDER_SITE_OTHER): Payer: Medicaid Other | Admitting: Obstetrics and Gynecology

## 2020-06-22 VITALS — BP 118/83 | HR 106 | Wt 187.0 lb

## 2020-06-22 DIAGNOSIS — O3442 Maternal care for other abnormalities of cervix, second trimester: Secondary | ICD-10-CM

## 2020-06-22 DIAGNOSIS — Z3A28 28 weeks gestation of pregnancy: Secondary | ICD-10-CM

## 2020-06-22 DIAGNOSIS — O09299 Supervision of pregnancy with other poor reproductive or obstetric history, unspecified trimester: Secondary | ICD-10-CM

## 2020-06-22 DIAGNOSIS — O09899 Supervision of other high risk pregnancies, unspecified trimester: Secondary | ICD-10-CM

## 2020-06-22 DIAGNOSIS — Z23 Encounter for immunization: Secondary | ICD-10-CM | POA: Diagnosis not present

## 2020-06-22 DIAGNOSIS — Z9889 Other specified postprocedural states: Secondary | ICD-10-CM

## 2020-06-22 DIAGNOSIS — Z98891 History of uterine scar from previous surgery: Secondary | ICD-10-CM

## 2020-06-22 DIAGNOSIS — O0990 Supervision of high risk pregnancy, unspecified, unspecified trimester: Secondary | ICD-10-CM

## 2020-06-22 NOTE — Progress Notes (Signed)
   PRENATAL VISIT NOTE  Subjective:  Courtney Torres is a 39 y.o. [id number] at [redacted]w[redacted]d being seen today for ongoing prenatal care.  She is currently monitored for the following issues for this high-risk pregnancy and has Generalized anxiety disorder; Insomnia; Supervision of high risk pregnancy, antepartum; Hx of preeclampsia, prior pregnancy, currently pregnant, unspecified trimester; History of cesarean section; History of preterm delivery, currently pregnant; Cystic fibrosis carrier; History of LEEP (loop electrosurgical excision procedure) of cervix complicating pregnancy in second trimester; Unwanted fertility; and [redacted] weeks gestation of pregnancy on their problem list.  Patient doing well with no acute concerns today. She reports no complaints.  Contractions: Not present. Vag. Bleeding: None.  Movement: Present. Denies leaking of fluid.   Pt states she stopped Makena approximately 2 weeks ago due to persistent migraines.  Migraines have improved and she denies any symptoms of preterm labor.  She is having 28 week testing today.  The following portions of the patient's history were reviewed and updated as appropriate: allergies, current medications, past family history, past medical history, past social history, past surgical history and problem list. Problem list updated.  Objective:   Vitals:   06/22/20 0837  BP: 118/83  Pulse: (!) 106  Weight: 187 lb (84.8 kg)    Fetal Status: Fetal Heart Rate (bpm): 150   Movement: Present     General:  Alert, oriented and cooperative. Patient is in no acute distress.  Skin: Skin is warm and dry. No rash noted.   Cardiovascular: Normal heart rate noted  Respiratory: Normal respiratory effort, no problems with respiration noted  Abdomen: Soft, gravid, appropriate for gestational age.  Pain/Pressure: Present     Pelvic: Cervical exam performed        Extremities: Normal range of motion.  Edema: Trace  Mental Status:  Normal mood and affect. Normal  behavior. Normal judgment and thought content.   Assessment and Plan:  Pregnancy: [id number] at [redacted]w[redacted]d  1. Supervision of high risk pregnancy, antepartum  - Glucose Tolerance, 2 Hours w/1 Hour - HIV Antibody (routine testing w rflx) - RPR - CBC  2. [redacted] weeks gestation of pregnancy   3. Hx of preeclampsia, prior pregnancy, currently pregnant, unspecified trimester BP normal  4. History of cesarean section Pt desires TOLAC, counseling performed and consent was signed.  Pt had c section 18 years ago and has had one successful VBAC at 33 weeks.  See separate note for counseling.  5. History of preterm delivery, currently pregnant No s/sx of preterm labor,   6. History of LEEP (loop electrosurgical excision procedure) of cervix complicating pregnancy in second trimester Baseline cervical length noted as 4.3  Preterm labor symptoms and general obstetric precautions including but not limited to vaginal bleeding, contractions, leaking of fluid and fetal movement were reviewed in detail with the patient.  Please refer to After Visit Summary for other counseling recommendations.   Return in about 2 weeks (around 07/06/2020) for [facility], in person.   Mariel Aloe, MD

## 2020-06-22 NOTE — Patient Instructions (Signed)
Vaginal Birth After Cesarean Delivery  Vaginal birth after cesarean delivery (VBAC) is giving birth vaginally after previously delivering a baby through a cesarean section (C-section). A VBAC may be a safe option for you, depending on your health and other factors. It is important to discuss VBAC with your health care provider early in your pregnancy so you can understand the risks, benefits, and options. Having these discussions early will give you time to make your birth plan. Who are the best candidates for VBAC? The best candidates for VBAC are women who:  Have had one or two prior cesarean deliveries, and the incision made during the delivery was horizontal (low transverse).  Do not have a vertical (classical) scar on their uterus.  Have not had a tear in the wall of their uterus (uterine rupture).  Plan to have more pregnancies. A VBAC is also more likely to be successful:  In women who have previously given birth vaginally.  When labor starts by itself (spontaneously) before the due date. What are the benefits of VBAC? The benefits of delivering your baby vaginally instead of by a cesarean delivery include:  A shorter hospital stay.  A faster recovery time.  Less pain.  Avoiding risks associated with major surgery, such as infection and blood clots.  Less blood loss and less need for donated blood (transfusions). What are the risks of VBAC? The main risk of attempting a VBAC is that it may fail, forcing your health care provider to deliver your baby by a C-section. Other risks are rare and include:  Tearing (rupture) of the scar from a past cesarean delivery.  Other risks associated with vaginal deliveries. If a repeat cesarean delivery is needed, the risks include:  Blood loss.  Infection.  Blood clot.  Damage to surrounding organs.  Removal of the uterus (hysterectomy), if it is damaged.  Placenta problems in future pregnancies. What else should I know  about my options? Delivering a baby through a VBAC is similar to having a normal spontaneous vaginal delivery. Therefore, it is safe:  To try with twins.  For your health care provider to try to turn the baby from a breech position (external cephalic version) during labor.  With epidural analgesia for pain relief. Consider where you would like to deliver your baby. VBAC should be attempted in facilities where an emergency cesarean delivery can be performed. VBAC is not recommended for home births. Any changes in your health or your baby's health during your pregnancy may make it necessary to change your initial decision about VBAC. Your health care provider may recommend that you do not attempt a VBAC if:  Your baby's suspected weight is 8.8 lb (4 kg) or more.  You have preeclampsia. This is a condition that causes high blood pressure along with other symptoms, such as swelling and headaches.  You will have VBAC less than 19 months after your cesarean delivery.  You are past your due date.  You need to have labor started (induced) because your cervix is not ready for labor (unfavorable). Where to find more information  American Pregnancy Association: americanpregnancy.org  American Congress of Obstetricians and Gynecologists: acog.org Summary  Vaginal birth after cesarean delivery (VBAC) is giving birth vaginally after previously delivering a baby through a cesarean section (C-section). A VBAC may be a safe option for you, depending on your health and other factors.  Discuss VBAC with your health care provider early in your pregnancy so you can understand the risks, benefits, options, and   have plenty of time to make your birth plan.  The main risk of attempting a VBAC is that it may fail, forcing your health care provider to deliver your baby by a C-section. Other risks are rare. This information is not intended to replace advice given to you by your health care provider. Make sure  you discuss any questions you have with your health care provider. Document Revised: 12/07/2018 Document Reviewed: 11/18/2016 Elsevier Patient Education  2020 Elsevier Inc.  

## 2020-06-22 NOTE — Progress Notes (Signed)
Pt presents for ROB and 2 gtt labs Migraines have subsided since discontinuing 17p Tdap offered; pt requests to wait  VBAC and BTL forms signed today

## 2020-06-22 NOTE — Addendum Note (Signed)
Addended by: Kennon Portela on: 06/22/2020 09:45 AM   Modules accepted: Orders

## 2020-06-22 NOTE — Progress Notes (Signed)
[facility] Attending Consult Note  39 y.o. [phone] at [redacted]w[redacted]d with Estimated Date of Delivery: 09/13/20 was seen today in office to discuss trial of labor after cesarean section (TOLAC) versus elective repeat cesarean delivery (ERCD). The following risks were discussed with the patient.  Risk of uterine rupture at term is 0.78 percent with TOLAC and 0.22 percent with ERCD. 1 in 10 uterine ruptures will result in neonatal death or neurological injury. The benefits of a trial of labor after cesarean (TOLAC) resulting in a vaginal birth after cesarean (VBAC) include the following: shorter length of hospital stay and postpartum recovery (in most cases); fewer complications, such as postpartum fever, wound or uterine infection, thromboembolism (blood clots in the leg or lung), need for blood transfusion and fewer neonatal breathing problems. The risks of an attempted VBAC or TOLAC include the following:  Risk of failed trial of labor after cesarean (TOLAC) without a vaginal birth after cesarean (VBAC) resulting in repeat cesarean delivery (RCD) in about 20 to 40 percent of women who attempt VBAC.   Risk of rupture of uterus resulting in an emergency cesarean delivery. The risk of uterine rupture may be related in part to the type of uterine incision made during the first cesarean delivery. A previous transverse uterine incision has the lowest risk of rupture (0.2 to 1.5 percent risk). Vertical or T-shaped uterine incisions have a higher risk of uterine rupture (4 to 9 percent risk)The risk of fetal death is very low with both VBAC and elective repeat cesarean delivery (ERCD), but the likelihood of fetal death is higher with VBAC than with ERCD. Maternal death is very rare with either type of delivery. The risks of an elective repeat cesarean delivery (ERCD) were reviewed with the patient including but not limited to: 09/998 risk of uterine rupture which could have serious consequences, bleeding which  may require transfusion; infection which may require antibiotics; injury to bowel, bladder or other surrounding organs (bowel, bladder, ureters); injury to the fetus; need for additional procedures including hysterectomy in the event of a life-threatening hemorrhage; thromboembolic phenomenon; abnormal placentation; incisional problems; death and other postoperative or anesthesia complications.    These risks and benefits are summarized on the consent form, which was reviewed with the patient during the visit.  All her questions answered and she signed a consent indicating a preference for TOLAC/ERCD. A copy of the consent was given to the patient.  (She does say that she would want tubal sterilization in the event a cesarean section is performed due to any maternal-fetal indication.)  (Patient also desires bilateral tubal sterilization (BTS) at the time of RCD, also discussed the additional risks of regret, failure rate of 0.5-1% with increased risk of ectopic gestation and permanent and irreversible nature of the procedure.  Also discussed possibility of post-tubal pain syndrome.  Other forms of reversible BCM discussed, also discussed vasectomy, patient desires BTS).  Will continue routine postpartum care.  Mariel Aloe, MD, FACOG Obstetrician & Gynecologist, [facility] for [facility], [facility]

## 2020-06-23 LAB — CBC
Hematocrit: 35.5 % (ref 34.0–46.6)
Hemoglobin: 11.7 g/dL (ref 11.1–15.9)
MCH: 31 pg (ref 26.6–33.0)
MCHC: 33 g/dL (ref 31.5–35.7)
MCV: 94 fL (ref 79–97)
Platelets: 241 10*3/uL (ref 150–450)
RBC: 3.78 x10E6/uL (ref 3.77–5.28)
RDW: 12.3 % (ref 11.7–15.4)
WBC: 12.5 10*3/uL — ABNORMAL HIGH (ref 3.4–10.8)

## 2020-06-23 LAB — GLUCOSE TOLERANCE, 2 HOURS W/ 1HR
Glucose, 1 hour: 143 mg/dL (ref 65–179)
Glucose, 2 hour: 133 mg/dL (ref 65–152)
Glucose, Fasting: 85 mg/dL (ref 65–91)

## 2020-06-23 LAB — RPR: RPR Ser Ql: NONREACTIVE

## 2020-06-23 LAB — HIV ANTIBODY (ROUTINE TESTING W REFLEX): HIV Screen 4th Generation wRfx: NONREACTIVE

## 2020-07-06 ENCOUNTER — Other Ambulatory Visit: Payer: Self-pay

## 2020-07-06 ENCOUNTER — Ambulatory Visit (INDEPENDENT_AMBULATORY_CARE_PROVIDER_SITE_OTHER): Payer: Medicaid Other | Admitting: Obstetrics & Gynecology

## 2020-07-06 VITALS — BP 127/83 | HR 93 | Wt 191.0 lb

## 2020-07-06 DIAGNOSIS — O09299 Supervision of pregnancy with other poor reproductive or obstetric history, unspecified trimester: Secondary | ICD-10-CM

## 2020-07-06 DIAGNOSIS — O0990 Supervision of high risk pregnancy, unspecified, unspecified trimester: Secondary | ICD-10-CM

## 2020-07-06 NOTE — Patient Instructions (Addendum)
Third Trimester of Pregnancy  The third trimester is from week 28 through week 40 (months 7 through 9). This trimester is when your unborn baby (fetus) is growing very fast. At the end of the ninth month, the unborn baby is about 20 inches in length. It weighs about 6-10 pounds. Follow these instructions at home: Medicines  Take over-the-counter and prescription medicines only as told by your doctor. Some medicines are safe and some medicines are not safe during pregnancy.  Take a prenatal vitamin that contains at least 600 micrograms (mcg) of folic acid.  If you have trouble pooping (constipation), take medicine that will make your stool soft (stool softener) if your doctor approves. Eating and drinking   Eat regular, healthy meals.  Avoid raw meat and uncooked cheese.  If you get low calcium from the food you eat, talk to your doctor about taking a daily calcium supplement.  Eat four or five small meals rather than three large meals a day.  Avoid foods that are high in fat and sugars, such as fried and sweet foods.  To prevent constipation: ? Eat foods that are high in fiber, like fresh fruits and vegetables, whole grains, and beans. ? Drink enough fluids to keep your pee (urine) clear or pale yellow. Activity  Exercise only as told by your doctor. Stop exercising if you start to have cramps.  Avoid heavy lifting, wear low heels, and sit up straight.  Do not exercise if it is too hot, too humid, or if you are in a place of great height (high altitude).  You may continue to have sex unless your doctor tells you not to. Relieving pain and discomfort  Wear a good support bra if your breasts are tender.  Take frequent breaks and rest with your legs raised if you have leg cramps or low back pain.  Take warm water baths (sitz baths) to soothe pain or discomfort caused by hemorrhoids. Use hemorrhoid cream if your doctor approves.  If you develop puffy, bulging veins (varicose  veins) in your legs: ? Wear support hose or compression stockings as told by your doctor. ? Raise (elevate) your feet for 15 minutes, 3-4 times a day. ? Limit salt in your food. Safety  Wear your seat belt when driving.  Make a list of emergency phone numbers, including numbers for family, friends, the hospital, and police and fire departments. Preparing for your baby's arrival To prepare for the arrival of your baby:  Take prenatal classes.  Practice driving to the hospital.  Visit the hospital and tour the maternity area.  Talk to your work about taking leave once the baby comes.  Pack your hospital bag.  Prepare the baby's room.  Go to your doctor visits.  Buy a rear-facing car seat. Learn how to install it in your car. General instructions  Do not use hot tubs, steam rooms, or saunas.  Do not use any products that contain nicotine or tobacco, such as cigarettes and e-cigarettes. If you need help quitting, ask your doctor.  Do not drink alcohol.  Do not douche or use tampons or scented sanitary pads.  Do not cross your legs for long periods of time.  Do not travel for long distances unless you must. Only do so if your doctor says it is okay.  Visit your dentist if you have not gone during your pregnancy. Use a soft toothbrush to brush your teeth. Be gentle when you floss.  Avoid cat litter boxes and soil   used by cats. These carry germs that can cause birth defects in the baby and can cause a loss of your baby (miscarriage) or stillbirth.  Keep all your prenatal visits as told by your doctor. This is important. Contact a doctor if:  You are not sure if you are in labor or if your water has broken.  You are dizzy.  You have mild cramps or pressure in your lower belly.  You have a nagging pain in your belly area.  You continue to feel sick to your stomach, you throw up, or you have watery poop.  You have bad smelling fluid coming from your vagina.  You have  pain when you pee. Get help right away if:  You have a fever.  You are leaking fluid from your vagina.  You are spotting or bleeding from your vagina.  You have severe belly cramps or pain.  You lose or gain weight quickly.  You have trouble catching your breath and have chest pain.  You notice sudden or extreme puffiness (swelling) of your face, hands, ankles, feet, or legs.  You have not felt the baby move in over an hour.  You have severe headaches that do not go away with medicine.  You have trouble seeing.  You are leaking, or you are having a gush of fluid, from your vagina before you are 37 weeks.  You have regular belly spasms (contractions) before you are 37 weeks. Summary  The third trimester is from week 28 through week 40 (months 7 through 9). This time is when your unborn baby is growing very fast.  Follow your doctor's advice about medicine, food, and activity.  Get ready for the arrival of your baby by taking prenatal classes, getting all the baby items ready, preparing the baby's room, and visiting your doctor to be checked.  Get help right away if you are bleeding from your vagina, or you have chest pain and trouble catching your breath, or if you have not felt your baby move in over an hour. This information is not intended to replace advice given to you by your health care provider. Make sure you discuss any questions you have with your health care provider. Document Revised: 12/02/2018 Document Reviewed: 09/16/2016 Elsevier Patient Education  2020 [employer]  Back Pain in Pregnancy Back pain during pregnancy is common. Back pain may be caused by several factors that are related to changes during your pregnancy. Follow these instructions at home: Managing pain, stiffness, and swelling      If directed, for sudden (acute) back pain, put ice on the painful area. ? Put ice in a plastic bag. ? Place a towel between your skin and the bag. ? Leave  the ice on for 20 minutes, 2-3 times per day.  If directed, apply heat to the affected area before you exercise. Use the heat source that your health care provider recommends, such as a moist heat pack or a heating pad. ? Place a towel between your skin and the heat source. ? Leave the heat on for 20-30 minutes. ? Remove the heat if your skin turns bright red. This is especially important if you are unable to feel pain, heat, or cold. You may have a greater risk of getting burned.  If directed, massage the affected area. Activity  Exercise as told by your health care provider. Gentle exercise is the best way to prevent or manage back pain.  Listen to your body when lifting. If lifting  ask for help or bend your knees. This uses your leg muscles instead of your back muscles.  Squat down when picking up something from the floor. Do not bend over.  Only use bed rest for short periods as told by your health care provider. Bed rest should only be used for the most severe episodes of back pain. Standing, sitting, and lying down  Do not stand in one place for long periods of time.  Use good posture when sitting. Make sure your head rests over your shoulders and is not hanging forward. Use a pillow on your lower back if necessary.  Try sleeping on your side, preferably the left side, with a pregnancy support pillow or 1-2 regular pillows between your legs. ? If you have back pain after a night's rest, your bed may be too soft. ? A firm mattress may provide more support for your back during pregnancy. General instructions  Do not wear high heels.  Eat a healthy diet. Try to gain weight within your health care provider's recommendations.  Use a maternity girdle, elastic sling, or back brace as told by your health care provider.  Take over-the-counter and prescription medicines only as told by your health care provider.  Work with a physical therapist or massage therapist to find ways to  manage back pain. Acupuncture or massage therapy may be helpful.  Keep all follow-up visits as told by your health care provider. This is important. Contact a health care provider if:  Your back pain interferes with your daily activities.  You have increasing pain in other parts of your body. Get help right away if:  You develop numbness, tingling, weakness, or problems with the use of your arms or legs.  You develop severe back pain that is not controlled with medicine.  You have a change in bowel or bladder control.  You develop shortness of breath, dizziness, or you faint.  You develop nausea, vomiting, or sweating.  You have back pain that is a rhythmic, cramping pain similar to labor pains. Labor pain is usually 1-2 minutes apart, lasts for about 1 minute, and involves a bearing down feeling or pressure in your pelvis.  You have back pain and your water breaks or you have vaginal bleeding.  You have back pain or numbness that travels down your leg.  Your back pain developed after you fell.  You develop pain on one side of your back.  You see blood in your urine.  You develop skin blisters in the area of your back pain. Summary  Back pain may be caused by several factors that are related to changes during your pregnancy.  Follow instructions as told by your health care provider for managing pain, stiffness, and swelling.  Exercise as told by your health care provider. Gentle exercise is the best way to prevent or manage back pain.  Take over-the-counter and prescription medicines only as told by your health care provider.  Keep all follow-up visits as told by your health care provider. This is important. This information is not intended to replace advice given to you by your health care provider. Make sure you discuss any questions you have with your health care provider. Document Revised: 11/30/2018 Document Reviewed: 01/27/2018 Elsevier Patient Education  2020  [employer] Inc[employer]  

## 2020-07-06 NOTE — Progress Notes (Signed)
ROB   CC: none    

## 2020-07-06 NOTE — Progress Notes (Signed)
   PRENATAL VISIT NOTE  Subjective:  Courtney Torres is a 39 y.o. [id number] at [redacted]w[redacted]d being seen today for ongoing prenatal care.  She is currently monitored for the following issues for this high-risk pregnancy and has Generalized anxiety disorder; Supervision of high risk pregnancy, antepartum; Hx of preeclampsia, prior pregnancy, currently pregnant, unspecified trimester; History of cesarean section; History of preterm delivery, currently pregnant; Cystic fibrosis carrier; History of LEEP (loop electrosurgical excision procedure) of cervix complicating pregnancy in second trimester; and Unwanted fertility on their problem list.  Patient reports backache.   . Vag. Bleeding: None.  Movement: Present. Denies leaking of fluid.   The following portions of the patient's history were reviewed and updated as appropriate: allergies, current medications, past family history, past medical history, past social history, past surgical history and problem list.   Objective:   Vitals:   07/06/20 0930  BP: 127/83  Pulse: 93  Weight: 191 lb (86.6 kg)    Fetal Status: Fetal Heart Rate (bpm): 147   Movement: Present     General:  Alert, oriented and cooperative. Patient is in no acute distress.  Skin: Skin is warm and dry. No rash noted.   Cardiovascular: Normal heart rate noted  Respiratory: Normal respiratory effort, no problems with respiration noted  Abdomen: Soft, gravid, appropriate for gestational age.  Pain/Pressure: Present     Pelvic: Cervical exam deferred        Extremities: Normal range of motion.  Edema: Trace  Mental Status: Normal mood and affect. Normal behavior. Normal judgment and thought content.   Assessment and Plan:  Pregnancy: [id number] at [redacted]w[redacted]d 1. Supervision of high risk pregnancy, antepartum Normal growth, passed 2 hr  2. Hx of preeclampsia, prior pregnancy, currently pregnant, unspecified trimester BP is good  Preterm labor symptoms and general obstetric precautions including  but not limited to vaginal bleeding, contractions, leaking of fluid and fetal movement were reviewed in detail with the patient. Please refer to After Visit Summary for other counseling recommendations.   Return in about 2 weeks (around 07/20/2020) for tell about babyscripts.  No future appointments.  Scheryl Darter, MD

## 2020-07-23 ENCOUNTER — Other Ambulatory Visit: Payer: Self-pay

## 2020-07-23 ENCOUNTER — Encounter: Payer: Self-pay | Admitting: Obstetrics and Gynecology

## 2020-07-23 ENCOUNTER — Ambulatory Visit (INDEPENDENT_AMBULATORY_CARE_PROVIDER_SITE_OTHER): Payer: Medicaid Other | Admitting: Obstetrics and Gynecology

## 2020-07-23 VITALS — BP 131/96 | HR 128 | Wt 200.6 lb

## 2020-07-23 DIAGNOSIS — O368130 Decreased fetal movements, third trimester, not applicable or unspecified: Secondary | ICD-10-CM

## 2020-07-23 DIAGNOSIS — O3442 Maternal care for other abnormalities of cervix, second trimester: Secondary | ICD-10-CM

## 2020-07-23 DIAGNOSIS — O09899 Supervision of other high risk pregnancies, unspecified trimester: Secondary | ICD-10-CM

## 2020-07-23 DIAGNOSIS — Z9889 Other specified postprocedural states: Secondary | ICD-10-CM

## 2020-07-23 DIAGNOSIS — R030 Elevated blood-pressure reading, without diagnosis of hypertension: Secondary | ICD-10-CM

## 2020-07-23 DIAGNOSIS — Z3009 Encounter for other general counseling and advice on contraception: Secondary | ICD-10-CM

## 2020-07-23 DIAGNOSIS — Z98891 History of uterine scar from previous surgery: Secondary | ICD-10-CM

## 2020-07-23 DIAGNOSIS — O09299 Supervision of pregnancy with other poor reproductive or obstetric history, unspecified trimester: Secondary | ICD-10-CM

## 2020-07-23 DIAGNOSIS — O0990 Supervision of high risk pregnancy, unspecified, unspecified trimester: Secondary | ICD-10-CM

## 2020-07-23 NOTE — Progress Notes (Signed)
ROB   CC:  Back pain and HA's pt has been taking tylenol not much relief.  Pt states he B/P readings at home have been elevated.  Pt no longer taking 17 P injections  Pt states she is feeling baby move however this past month movement seems different and not as before. Notes Good FM after eating. Pt is concerned.

## 2020-07-23 NOTE — Progress Notes (Signed)
   PRENATAL VISIT NOTE  Subjective:  Derrick Orris is a 39 y.o. [id number] at [redacted]w[redacted]d being seen today for ongoing prenatal care.  She is currently monitored for the following issues for this high-risk pregnancy and has Generalized anxiety disorder; Supervision of high risk pregnancy, antepartum; Hx of preeclampsia, prior pregnancy, currently pregnant, unspecified trimester; History of cesarean section; History of preterm delivery, currently pregnant; Cystic fibrosis carrier; History of LEEP (loop electrosurgical excision procedure) of cervix complicating pregnancy in second trimester; and Unwanted fertility on their problem list.  Patient reports headache, intermittently, comes and goes, tylenol doesn't help but it will go away eventually. Occasionally feels pressure in her eyes. When she has felt this at home, BP was 146/94. Contractions: Not present. Vag. Bleeding: None.  Movement: (!) Decreased.  Denies leaking of fluid.   The following portions of the patient's history were reviewed and updated as appropriate: allergies, current medications, past family history, past medical history, past social history, past surgical history and problem list.   Objective:   Vitals:   07/23/20 0959  BP: (!) 131/96  Pulse: (!) 128  Weight: 200 lb 9.6 oz (91 kg)    Fetal Status: Fetal Heart Rate (bpm): 155   Movement: (!) Decreased     General:  Alert, oriented and cooperative. Patient is in no acute distress.  Skin: Skin is warm and dry. No rash noted.   Cardiovascular: Normal heart rate noted  Respiratory: Normal respiratory effort, no problems with respiration noted  Abdomen: Soft, gravid, appropriate for gestational age.  Pain/Pressure: Absent     Pelvic: Cervical exam deferred        Extremities: Normal range of motion.  Edema: Deep pitting, indentation remains for a short time  Mental Status: Normal mood and affect. Normal behavior. Normal judgment and thought content.   Assessment and Plan:    Pregnancy: [id number] at [redacted]w[redacted]d  1. History of LEEP (loop electrosurgical excision procedure) of cervix complicating pregnancy in second trimester With 1st pregnancy  2. Supervision of high risk pregnancy, antepartum  3. Hx of preeclampsia, prior pregnancy, currently pregnant, unspecified trimester  4. History of cesarean section For TOLAC  5. History of preterm delivery, currently pregnant On [place]  6. Unwanted fertility For BTL  7. Elevated BP without diagnosis of hypertension Reviewed s/s of PEC, likelihood of diagnosis given history and newly elevated BP - CBC - Comprehensive metabolic panel - Protein / creatinine ratio, urine - [place] MFM OB FOLLOW UP; Future  will likely need IOL at 37 weeks, reviewed need another BP for diagnosis  -pt verbalizes understanding of the above  8. Decreased fetal movements in third trimester, single or unspecified fetus After long discussion, patient reports baby is moving regularly, movements are not as pronounced as earlier in pregnancy, reviewed kick counts  - to MAU as needed  Preterm labor symptoms and general obstetric precautions including but not limited to vaginal bleeding, contractions, leaking of fluid and fetal movement were reviewed in detail with the patient. Please refer to After Visit Summary for other counseling recommendations.   Return in about 1 week (around 07/30/2020) for high OB, in person.  No future appointments.  Conan Bowens, MD

## 2020-07-23 NOTE — Patient Instructions (Signed)
HOW TO TAKE YOUR BLOOD PRESSURE AT HOME  Take your blood pressure at home. Pick one day a week and take your blood pressure consistently every week on this day. Relax quietly for about 15 minutes then take your blood pressure. DO NOT take it when you are upset, stressed, or have just been active. Make sure your blood pressure cuff fits appropriately, there should be measurements on the box and on the cuff to help guide you.   Once you have taken your blood pressure, look at the numbers. If the top number (systolic) is equal to or above 140, please call the office and let us know. If the bottom number (diastolic) is equal to or above 90, please call the office and let us know.   If you have any questions or are concerned you are not taking your blood pressure correctly, please call the office!  If you have a headache that does not improve, problems with your vision, abdominal pain or other concerns, please call and let us know. Please go to the hospital with any severe symptoms. Please go to the hospital for labor, painful contractions every 5 minutes or less, leaking of fluid, or if you have not felt normal fetal movement.

## 2020-07-24 LAB — COMPREHENSIVE METABOLIC PANEL
ALT: 7 IU/L (ref 0–32)
AST: 13 IU/L (ref 0–40)
Albumin/Globulin Ratio: 1.2 (ref 1.2–2.2)
Albumin: 3.4 g/dL — ABNORMAL LOW (ref 3.8–4.8)
Alkaline Phosphatase: 127 IU/L — ABNORMAL HIGH (ref 44–121)
BUN/Creatinine Ratio: 11 (ref 9–23)
BUN: 6 mg/dL (ref 6–20)
Bilirubin Total: 0.2 mg/dL (ref 0.0–1.2)
CO2: 21 mmol/L (ref 20–29)
Calcium: 9.3 mg/dL (ref 8.7–10.2)
Chloride: 103 mmol/L (ref 96–106)
Creatinine, Ser: 0.53 mg/dL — ABNORMAL LOW (ref 0.57–1.00)
GFR calc Af Amer: 140 mL/min/{1.73_m2} (ref >59)
GFR calc non Af Amer: 122 mL/min/{1.73_m2} (ref >59)
Globulin, Total: 2.8 g/dL (ref 1.5–4.5)
Glucose: 89 mg/dL (ref 65–99)
Potassium: 4.2 mmol/L (ref 3.5–5.2)
Sodium: 137 mmol/L (ref 134–144)
Total Protein: 6.2 g/dL (ref 6.0–8.5)

## 2020-07-24 LAB — CBC
Hematocrit: 36.6 % (ref 34.0–46.6)
Hemoglobin: 12.5 g/dL (ref 11.1–15.9)
MCH: 31.6 pg (ref 26.6–33.0)
MCHC: 34.2 g/dL (ref 31.5–35.7)
MCV: 92 fL (ref 79–97)
Platelets: 199 10*3/uL (ref 150–450)
RBC: 3.96 x10E6/uL (ref 3.77–5.28)
RDW: 12.6 % (ref 11.7–15.4)
WBC: 10.5 10*3/uL (ref 3.4–10.8)

## 2020-07-24 LAB — PROTEIN / CREATININE RATIO, URINE
Creatinine, Urine: 38 mg/dL
Protein, Ur: 4.9 mg/dL
Protein/Creat Ratio: 129 mg/g creat (ref 0–200)

## 2020-07-25 ENCOUNTER — Other Ambulatory Visit: Payer: Self-pay

## 2020-07-25 ENCOUNTER — Inpatient Hospital Stay (HOSPITAL_COMMUNITY)
Admission: AD | Admit: 2020-07-25 | Discharge: 2020-07-25 | Disposition: A | Discharge status: Home / Self Care | Payer: Medicaid Other | Attending: Obstetrics & Gynecology | Admitting: Obstetrics & Gynecology

## 2020-07-25 ENCOUNTER — Encounter (HOSPITAL_COMMUNITY): Payer: Self-pay | Admitting: Obstetrics & Gynecology

## 2020-07-25 DIAGNOSIS — Z87891 Personal history of nicotine dependence: Secondary | ICD-10-CM | POA: Diagnosis not present

## 2020-07-25 DIAGNOSIS — O133 Gestational [pregnancy-induced] hypertension without significant proteinuria, third trimester: Secondary | ICD-10-CM | POA: Diagnosis present

## 2020-07-25 DIAGNOSIS — O139 Gestational [pregnancy-induced] hypertension without significant proteinuria, unspecified trimester: Secondary | ICD-10-CM

## 2020-07-25 DIAGNOSIS — Z3A32 32 weeks gestation of pregnancy: Secondary | ICD-10-CM | POA: Diagnosis not present

## 2020-07-25 LAB — COMPREHENSIVE METABOLIC PANEL
ALT: 19 U/L (ref 0–44)
AST: 27 U/L (ref 15–41)
Albumin: 2.9 g/dL — ABNORMAL LOW (ref 3.5–5.0)
Alkaline Phosphatase: 107 U/L (ref 38–126)
Anion gap: 9 (ref 5–15)
BUN: <5 mg/dL — ABNORMAL LOW (ref 6–20)
CO2: 21 mmol/L — ABNORMAL LOW (ref 22–32)
Calcium: 8.8 mg/dL — ABNORMAL LOW (ref 8.9–10.3)
Chloride: 105 mmol/L (ref 98–111)
Creatinine, Ser: 0.87 mg/dL (ref 0.44–1.00)
GFR, Estimated: >60 mL/min (ref >60)
Glucose, Bld: 98 mg/dL (ref 70–99)
Potassium: 3.5 mmol/L (ref 3.5–5.1)
Sodium: 135 mmol/L (ref 135–145)
Total Bilirubin: 0.4 mg/dL (ref 0.3–1.2)
Total Protein: 6.2 g/dL — ABNORMAL LOW (ref 6.5–8.1)

## 2020-07-25 LAB — PROTEIN / CREATININE RATIO, URINE
Creatinine, Urine: 18.47 mg/dL
Total Protein, Urine: <6 mg/dL

## 2020-07-25 LAB — CBC
HCT: 34.3 % — ABNORMAL LOW (ref 36.0–46.0)
Hemoglobin: 12 g/dL (ref 12.0–15.0)
MCH: 31.8 pg (ref 26.0–34.0)
MCHC: 35 g/dL (ref 30.0–36.0)
MCV: 91 fL (ref 80.0–100.0)
Platelets: 180 10*3/uL (ref 150–400)
RBC: 3.77 MIL/uL — ABNORMAL LOW (ref 3.87–5.11)
RDW: 12.9 % (ref 11.5–15.5)
WBC: 10.3 10*3/uL (ref 4.0–10.5)
nRBC: 0 % (ref 0.0–0.2)

## 2020-07-25 LAB — URINALYSIS, ROUTINE W REFLEX MICROSCOPIC
Bilirubin Urine: NEGATIVE
Glucose, UA: NEGATIVE mg/dL
Hgb urine dipstick: NEGATIVE
Ketones, ur: NEGATIVE mg/dL
Leukocytes,Ua: NEGATIVE
Nitrite: NEGATIVE
Protein, ur: NEGATIVE mg/dL
Specific Gravity, Urine: 1.002 — ABNORMAL LOW (ref 1.005–1.030)
pH: 7 (ref 5.0–8.0)

## 2020-07-25 NOTE — MAU Note (Addendum)
B/P was high at [facility] appt. Hx preeclampsia. R hand tingly, noticed face was puffy. Called office and told to come in. Denies VB or LOF. Pressure behind eyes and "feel like I have a headache coming on on left side of my head" since on the way to hospital. Some low back pain but that is not new

## 2020-07-25 NOTE — MAU Provider Note (Addendum)
Chief Complaint:  Hypertension   First Provider Initiated Contact with Patient 07/25/20 2116      HPI: Courtney Torres is a 39 y.o. [id number] at 8455w6d by LMP who presents to maternity admissions reporting elevated BP at home, face swelling, hands tingling, and feeling pressure behind her eyes.  She reported "I feel like a headache might come on but has not yet." She denies any pain in her head, but mild pressure on her way in to MAU that has since resolved.  She had PEC and was delivered by C/S at 2462w5d related to HTN.  She had PTL with her second pregnancy and delivered at 33 weeks vaginally.  She has not tried any treatments, there are no other associated symptoms.   She reports good fetal movement.   HPI  Past Medical History: Past Medical History:  Diagnosis Date  . Abnormal Pap smear of cervix   . Anxiety   . Depression   . Pregnancy induced hypertension   . Preterm labor     Past obstetric history: OB History  Gravida Para Term Preterm AB Living  3 2   2   2   SAB TAB Ectopic Multiple Live Births          2    # Outcome Date GA Lbr Len/2nd Weight Sex Delivery Anes PTL Lv  3 Current           2 Preterm 01/04/13 5328w0d  2098 g M Vag-Spont   LIV  1 Preterm 05/06/02 4162w5d  3572 g F CS-LTranv  Y LIV    Obstetric Comments  Pre-eclampsia with 1st, induced and then CS bc baby was breech    Past Surgical History: Past Surgical History:  Procedure Laterality Date  . CERVICAL BIOPSY  W/ LOOP ELECTRODE EXCISION    . CESAREAN SECTION    . COLPOSCOPY    . TONSILLECTOMY AND ADENOIDECTOMY      Family History: Family History  Problem Relation Age of Onset  . Hypertension Father   . Arthritis Father   . Asthma Father   . COPD Father   . Heart failure Sister   . Early death Sister   . Heart disease Sister   . Hypertension Brother   . Alcohol abuse Brother   . Drug abuse Brother   . Hypertension Paternal Grandfather   . Anxiety disorder Mother   . Arthritis Mother   .  Depression Mother     Social History: Social History   Tobacco Use  . Smoking status: Former Smoker    Packs/day: 0.50    Types: Cigarettes  . Smokeless tobacco: Never Used  Vaping Use  . Vaping Use: Never used  Substance Use Topics  . Alcohol use: Not Currently    Comment: occ  . Drug use: No    Allergies: No Known Allergies  Meds:  No medications prior to admission.    ROS:  Review of Systems  Constitutional: Negative for chills, fatigue and fever.  HENT: Positive for facial swelling.   Eyes: Negative for visual disturbance.  Respiratory: Negative for shortness of breath.   Cardiovascular: Negative for chest pain.  Gastrointestinal: Negative for abdominal pain, nausea and vomiting.  Genitourinary: Negative for difficulty urinating, dysuria, flank pain, pelvic pain, vaginal bleeding, vaginal discharge and vaginal pain.  Neurological: Negative for dizziness and headaches.  Psychiatric/Behavioral: Negative.      I have reviewed patient's Past Medical Hx, Surgical Hx, Family Hx, Social Hx, medications and allergies.   Physical Exam  Patient Vitals for the past 24 hrs:  BP Temp Pulse Resp SpO2 Height Weight  07/25/20 2201 (!) 141/90 -- 81 -- -- -- --  07/25/20 2200 -- -- -- -- 98 % -- --  07/25/20 2131 (!) 140/97 -- 89 -- -- -- --  07/25/20 2130 -- -- -- -- 98 % -- --  07/25/20 2125 -- -- -- -- 99 % -- --  07/25/20 2120 -- -- -- -- 99 % -- --  07/25/20 2105 -- -- -- -- 98 % -- --  07/25/20 2101 (!) 134/91 -- 89 -- -- -- --  07/25/20 2100 -- -- -- -- 98 % -- --  07/25/20 2055 -- -- -- -- 98 % -- --  07/25/20 2050 -- -- -- -- 99 % -- --  07/25/20 2045 -- -- -- -- 98 % -- --  07/25/20 2040 -- -- -- -- 98 % -- --  07/25/20 2035 -- -- -- -- 99 % -- --  07/25/20 2031 (!) 138/95 -- (!) 115 -- -- -- --  07/25/20 2030 -- -- -- -- 99 % -- --  07/25/20 2025 -- -- -- -- 99 % -- --  07/25/20 2020 -- -- -- -- 98 % -- --  07/25/20 2015 -- -- -- -- 98 % -- --  07/25/20  2010 -- -- -- -- 98 % -- --  07/25/20 2005 -- -- -- -- 98 % -- --  07/25/20 2001 (!) 143/93 -- 92 -- -- -- --  07/25/20 2000 -- -- -- -- 98 % -- --  07/25/20 1955 -- -- -- -- 97 % -- --  07/25/20 1950 -- -- -- -- 98 % -- --  07/25/20 1948 (!) 156/106 -- 100 -- -- -- --  07/25/20 1945 -- -- -- -- 98 % -- --  07/25/20 1940 -- -- -- -- 98 % -- --  07/25/20 1937 (!) 150/106 -- (!) 105 -- -- -- --  07/25/20 1935 -- -- -- -- 98 % -- --  07/25/20 1923 -- -- 93 -- 100 % -- --  07/25/20 1920 (!) 150/109 -- (!) 120 -- -- -- --  07/25/20 1918 -- 98.3 F (36.8 C) -- 18 -- 5\' 4"  (1.626 m) 91.6 kg   Constitutional: Well-developed, well-nourished female in no acute distress.  HEART: normal rate, heart sounds, regular rhythm RESP: normal effort, lung sounds clear and equal bilaterally GI: Abd soft, non-tender, gravid appropriate for gestational age.  MS: Extremities nontender, no edema, normal ROM Neurologic: Alert and oriented x 4.  GU: Neg CVAT.     FHT:  Baseline 135 , moderate variability, accelerations present, no decelerations Contractions: none on toco or to palpation   Labs: Results for orders placed or performed during the hospital encounter of 07/25/20 (from the past 24 hour(s))  Urinalysis, Routine w reflex microscopic Urine, Clean Catch     Status: Abnormal   Collection Time: 07/25/20  7:21 PM  Result Value Ref Range   Color, Urine COLORLESS (A) YELLOW   APPearance CLEAR CLEAR   Specific Gravity, Urine 1.002 (L) 1.005 - 1.030   pH 7.0 5.0 - 8.0   Glucose, UA NEGATIVE NEGATIVE mg/dL   Hgb urine dipstick NEGATIVE NEGATIVE   Bilirubin Urine NEGATIVE NEGATIVE   Ketones, ur NEGATIVE NEGATIVE mg/dL   Protein, ur NEGATIVE NEGATIVE mg/dL   Nitrite NEGATIVE NEGATIVE   Leukocytes,Ua NEGATIVE NEGATIVE  Protein / creatinine ratio, urine     Status: None   Collection  Time: 07/25/20  7:21 PM  Result Value Ref Range   Creatinine, Urine 18.47 mg/dL   Total Protein, Urine <6 mg/dL    Protein Creatinine Ratio        0.00 - 0.15 mg/mg[Cre]  CBC     Status: Abnormal   Collection Time: 07/25/20  8:25 PM  Result Value Ref Range   WBC 10.3 4.0 - 10.5 K/uL   RBC 3.77 (L) 3.87 - 5.11 MIL/uL   Hemoglobin 12.0 12.0 - 15.0 g/dL   HCT 02.5 (L) 36 - 46 %   MCV 91.0 80.0 - 100.0 fL   MCH 31.8 26.0 - 34.0 pg   MCHC 35.0 30.0 - 36.0 g/dL   RDW 42.7 06.2 - 37.6 %   Platelets 180 150 - 400 K/uL   nRBC 0.0 0.0 - 0.2 %  Comprehensive metabolic panel     Status: Abnormal   Collection Time: 07/25/20  8:25 PM  Result Value Ref Range   Sodium 135 135 - 145 mmol/L   Potassium 3.5 3.5 - 5.1 mmol/L   Chloride 105 98 - 111 mmol/L   CO2 21 (L) 22 - 32 mmol/L   Glucose, Bld 98 70 - 99 mg/dL   BUN <5 (L) 6 - 20 mg/dL   Creatinine, Ser 2.83 0.44 - 1.00 mg/dL   Calcium 8.8 (L) 8.9 - 10.3 mg/dL   Total Protein 6.2 (L) 6.5 - 8.1 g/dL   Albumin 2.9 (L) 3.5 - 5.0 g/dL   AST 27 15 - 41 U/L   ALT 19 0 - 44 U/L   Alkaline Phosphatase 107 38 - 126 U/L   Total Bilirubin 0.4 0.3 - 1.2 mg/dL   GFR, Estimated >15 >17 mL/min   Anion gap 9 5 - 15   B/Positive/-- (06/25 1135)  Imaging:  No results found.  MAU Course/MDM: Orders Placed This Encounter  Procedures  . Urinalysis, Routine w reflex microscopic Urine, Clean Catch  . CBC  . Comprehensive metabolic panel  . Protein / creatinine ratio, urine  . Fetal non-stress test  . Measure blood pressure  . Discharge patient    No orders of the defined types were placed in this encounter.    NST reviewed and reactive Pt reported pressure sensation in her head has resolved, no headache or other s/sx of PEC PEC labs wnl Consult Dr Despina Hidden with presentation, exam findings and test results.  Dx today of GHTN, no s/sx of PEC D/C home with outpatient follow up, start antenatal testing BP check in 2 days, prenatal visit in 5 days as scheduled, [place] in 6 days including BPP PEC precautions/reasons to return to MAU reviewed   Assessment: 1.  Gestational hypertension without significant proteinuria in third trimester   2. [redacted] weeks gestation of pregnancy     Plan: Discharge home Labor precautions and fetal kick counts  Follow-up Information    [facility] Follow up.   Why: For blood pressure check on Friday, 07/27/20, then for prenatal visit on Monday, 07/30/20. Return to MAU for emergencies.  Contact information: [address] [place] [address] [phone]             Allergies as of 07/25/2020   No Known Allergies     Medication List    STOP taking these medications   hydroxyprogesterone caproate 250 mg/mL Oil injection Commonly known as: MAKENA     TAKE these medications   aspirin EC 81 MG tablet Take 1 tablet (81  mg total) by mouth daily. Swallow whole.   prenatal multivitamin Tabs tablet Take 1 tablet by mouth daily at 12 noon.       Sharen Counter Certified Nurse-Midwife 07/25/2020 10:58 PM

## 2020-07-27 ENCOUNTER — Other Ambulatory Visit: Payer: Self-pay

## 2020-07-27 ENCOUNTER — Ambulatory Visit: Payer: Medicaid Other

## 2020-07-27 VITALS — BP 121/86 | HR 91

## 2020-07-27 DIAGNOSIS — Z0130 Encounter for examination of blood pressure without abnormal findings: Secondary | ICD-10-CM

## 2020-07-27 NOTE — Progress Notes (Signed)
Patient was assessed and managed by nursing staff during this encounter. I have reviewed the chart and agree with the documentation and plan. I have also made any necessary editorial changes.  Warden Fillers, MD 07/27/2020 8:33 AM

## 2020-07-27 NOTE — Progress Notes (Signed)
..  Subjective:  Courtney Torres is a 39 y.o. female here for BP check.   Hypertension ROS: Pt is currently not on any BP medications, reports occasional headaches but denies any abnormal symptoms today.    Objective:  BP 121/86   Pulse 91   LMP 12/08/2019 (Exact Date)   Appearance alert, well appearing, and in no distress. General exam BP noted to be well controlled today in office.    Assessment:   Blood Pressure stable.   Plan:  Current treatment plan is effective, no change in therapy.Marland Kitchen Next appt scheduled for 07-30-20.

## 2020-07-30 ENCOUNTER — Ambulatory Visit (INDEPENDENT_AMBULATORY_CARE_PROVIDER_SITE_OTHER): Payer: Medicaid Other | Admitting: Advanced Practice Midwife

## 2020-07-30 ENCOUNTER — Observation Stay (HOSPITAL_COMMUNITY)
Admission: AD | Admit: 2020-07-30 | Discharge: 2020-07-31 | Disposition: A | Discharge status: Home / Self Care | Payer: Medicaid Other | Attending: Obstetrics and Gynecology | Admitting: Obstetrics and Gynecology

## 2020-07-30 ENCOUNTER — Encounter (HOSPITAL_COMMUNITY): Payer: Self-pay | Admitting: Obstetrics and Gynecology

## 2020-07-30 ENCOUNTER — Other Ambulatory Visit: Payer: Self-pay

## 2020-07-30 ENCOUNTER — Telehealth: Payer: Self-pay | Admitting: Advanced Practice Midwife

## 2020-07-30 VITALS — BP 143/84 | HR 91 | Wt 200.0 lb

## 2020-07-30 DIAGNOSIS — R102 Pelvic and perineal pain: Secondary | ICD-10-CM | POA: Diagnosis not present

## 2020-07-30 DIAGNOSIS — Z87891 Personal history of nicotine dependence: Secondary | ICD-10-CM | POA: Diagnosis not present

## 2020-07-30 DIAGNOSIS — O133 Gestational [pregnancy-induced] hypertension without significant proteinuria, third trimester: Secondary | ICD-10-CM

## 2020-07-30 DIAGNOSIS — Z7982 Long term (current) use of aspirin: Secondary | ICD-10-CM | POA: Diagnosis not present

## 2020-07-30 DIAGNOSIS — Z3A33 33 weeks gestation of pregnancy: Secondary | ICD-10-CM

## 2020-07-30 DIAGNOSIS — O09899 Supervision of other high risk pregnancies, unspecified trimester: Secondary | ICD-10-CM

## 2020-07-30 DIAGNOSIS — Z3009 Encounter for other general counseling and advice on contraception: Secondary | ICD-10-CM

## 2020-07-30 DIAGNOSIS — O0990 Supervision of high risk pregnancy, unspecified, unspecified trimester: Secondary | ICD-10-CM

## 2020-07-30 DIAGNOSIS — O139 Gestational [pregnancy-induced] hypertension without significant proteinuria, unspecified trimester: Secondary | ICD-10-CM | POA: Diagnosis present

## 2020-07-30 DIAGNOSIS — Z20822 Contact with and (suspected) exposure to covid-19: Secondary | ICD-10-CM | POA: Insufficient documentation

## 2020-07-30 DIAGNOSIS — O4703 False labor before 37 completed weeks of gestation, third trimester: Secondary | ICD-10-CM

## 2020-07-30 DIAGNOSIS — Z98891 History of uterine scar from previous surgery: Secondary | ICD-10-CM

## 2020-07-30 LAB — COMPREHENSIVE METABOLIC PANEL
ALT: 16 U/L (ref 0–44)
AST: 20 U/L (ref 15–41)
Albumin: 3 g/dL — ABNORMAL LOW (ref 3.5–5.0)
Alkaline Phosphatase: 123 U/L (ref 38–126)
Anion gap: 11 (ref 5–15)
BUN: 6 mg/dL (ref 6–20)
CO2: 19 mmol/L — ABNORMAL LOW (ref 22–32)
Calcium: 9.1 mg/dL (ref 8.9–10.3)
Chloride: 105 mmol/L (ref 98–111)
Creatinine, Ser: 0.6 mg/dL (ref 0.44–1.00)
GFR, Estimated: >60 mL/min (ref >60)
Glucose, Bld: 105 mg/dL — ABNORMAL HIGH (ref 70–99)
Potassium: 3.6 mmol/L (ref 3.5–5.1)
Sodium: 135 mmol/L (ref 135–145)
Total Bilirubin: 0.2 mg/dL — ABNORMAL LOW (ref 0.3–1.2)
Total Protein: 6.4 g/dL — ABNORMAL LOW (ref 6.5–8.1)

## 2020-07-30 LAB — CBC
HCT: 36.3 % (ref 36.0–46.0)
Hemoglobin: 12.7 g/dL (ref 12.0–15.0)
MCH: 32.2 pg (ref 26.0–34.0)
MCHC: 35 g/dL (ref 30.0–36.0)
MCV: 91.9 fL (ref 80.0–100.0)
Platelets: 231 10*3/uL (ref 150–400)
RBC: 3.95 MIL/uL (ref 3.87–5.11)
RDW: 13 % (ref 11.5–15.5)
WBC: 11.8 10*3/uL — ABNORMAL HIGH (ref 4.0–10.5)
nRBC: 0 % (ref 0.0–0.2)

## 2020-07-30 LAB — TYPE AND SCREEN
ABO/RH(D): B POS
Antibody Screen: NEGATIVE

## 2020-07-30 LAB — RESP PANEL BY RT-PCR (FLU A&B, COVID) ARPGX2
Influenza A by PCR: NEGATIVE
Influenza B by PCR: NEGATIVE
SARS Coronavirus 2 by RT PCR: NEGATIVE

## 2020-07-30 LAB — PROTEIN / CREATININE RATIO, URINE
Creatinine, Urine: 25.57 mg/dL
Total Protein, Urine: <6 mg/dL

## 2020-07-30 LAB — FETAL FIBRONECTIN: Fetal Fibronectin: NEGATIVE

## 2020-07-30 MED ORDER — PRENATAL MULTIVITAMIN CH
1.0000 | ORAL_TABLET | Freq: Every day | ORAL | Status: DC
  Administered 2020-07-31: 1 via ORAL
  Filled 2020-07-30: qty 1

## 2020-07-30 MED ORDER — LABETALOL HCL 5 MG/ML IV SOLN: 80.0000 mg | INTRAVENOUS | Status: DC | PRN

## 2020-07-30 MED ORDER — BETAMETHASONE SOD PHOS & ACET 6 (3-3) MG/ML IJ SUSP
12.0000 mg | Freq: Once | INTRAMUSCULAR | Status: AC
  Administered 2020-07-30: 12 mg via INTRAMUSCULAR
  Filled 2020-07-30: qty 5

## 2020-07-30 MED ORDER — BETAMETHASONE SOD PHOS & ACET 6 (3-3) MG/ML IJ SUSP: 12.0000 mg | Freq: Once | INTRAMUSCULAR | Status: DC

## 2020-07-30 MED ORDER — DOCUSATE SODIUM 100 MG PO CAPS
100.0000 mg | ORAL_CAPSULE | Freq: Every day | ORAL | Status: DC
  Administered 2020-07-31: 100 mg via ORAL
  Filled 2020-07-30: qty 1

## 2020-07-30 MED ORDER — LACTATED RINGERS IV SOLN: INTRAVENOUS | Status: DC

## 2020-07-30 MED ORDER — CALCIUM CARBONATE ANTACID 500 MG PO CHEW: 2.0000 | CHEWABLE_TABLET | ORAL | Status: DC | PRN

## 2020-07-30 MED ORDER — LABETALOL HCL 5 MG/ML IV SOLN: 20.0000 mg | INTRAVENOUS | Status: DC | PRN

## 2020-07-30 MED ORDER — LABETALOL HCL 5 MG/ML IV SOLN: 40.0000 mg | INTRAVENOUS | Status: DC | PRN

## 2020-07-30 MED ORDER — ZOLPIDEM TARTRATE 5 MG PO TABS
5.0000 mg | ORAL_TABLET | Freq: Every evening | ORAL | Status: DC | PRN
  Administered 2020-07-30: 5 mg via ORAL
  Filled 2020-07-30: qty 1

## 2020-07-30 MED ORDER — LABETALOL HCL 5 MG/ML IV SOLN
INTRAVENOUS | Status: AC
  Administered 2020-07-30: 20 mg via INTRAVENOUS
  Filled 2020-07-30: qty 4

## 2020-07-30 MED ORDER — HYDRALAZINE HCL 20 MG/ML IJ SOLN: 10.0000 mg | INTRAMUSCULAR | Status: DC | PRN

## 2020-07-30 MED ORDER — ACETAMINOPHEN 325 MG PO TABS: 650.0000 mg | ORAL_TABLET | ORAL | Status: DC | PRN

## 2020-07-30 NOTE — Patient Instructions (Addendum)
Reasons to return to MAU at [facility]:  Since you are preterm, return to MAU if:  1.  Contractions are 10 minutes apart or less and they becoming more uncomfortable or painful over time 2.  You have a large gush of fluid, or a trickle of fluid that will not stop and you have to wear a pad 3.  You have bleeding that is bright red, heavier than spotting--like menstrual bleeding (spotting can be normal in early labor or after a check of your cervix) 4.  You do not feel the baby moving like he/she normally does  Hypertension During Pregnancy High blood pressure (hypertension) is when the force of blood pumping through the arteries is too strong. Arteries are blood vessels that carry blood from the heart throughout the body. Hypertension during pregnancy can be mild or severe. Severe hypertension during pregnancy (preeclampsia) is a medical emergency that requires prompt evaluation and treatment. Different types of hypertension can happen during pregnancy. These include:  Chronic hypertension. This happens when you had high blood pressure before you became pregnant, and it continues during the pregnancy. Hypertension that develops before you are [redacted] weeks pregnant and continues during the pregnancy is also called chronic hypertension. If you have chronic hypertension, it will not go away after you have your baby. You will need follow-up visits with your health care provider after you have your baby. Your doctor may want you to keep taking medicine for your blood pressure.  Gestational hypertension. This is hypertension that develops after the 20th week of pregnancy. Gestational hypertension usually goes away after you have your baby, but your health care provider will need to monitor your blood pressure to make sure that it is getting better.  Preeclampsia. This is severe hypertension during pregnancy. This can cause serious complications for you and your baby and can also  cause complications for you after the delivery of your baby.  Postpartum preeclampsia. You may develop severe hypertension after giving birth. This usually occurs within 48 hours after childbirth but may occur up to 6 weeks after giving birth. This is rare. How does this affect me? Women who have hypertension during pregnancy have a greater chance of developing hypertension later in life or during future pregnancies. In some cases, hypertension during pregnancy can cause serious complications, such as:  Stroke.  Heart attack.  Injury to other organs, such as kidneys, lungs, or liver.  Preeclampsia.  Convulsions or seizures.  Placental abruption. How does this affect my baby? Hypertension during pregnancy can affect your baby. Your baby may:  Be born early (prematurely).  Not weigh as much as he or she should at birth (low birth weight).  Not tolerate labor well, leading to an unplanned cesarean delivery. What are the risks? There are certain factors that make it more likely for you to develop hypertension during pregnancy. These include:  Having hypertension during a previous pregnancy.  Being overweight.  Being age 21 or older.  Being pregnant for the first time.  Being pregnant with more than one baby.  Becoming pregnant using fertilization methods, such as IVF (in vitro fertilization).  Having other medical problems, such as diabetes, kidney disease, or lupus.  Having a family history of hypertension. What can I do to lower my risk? The exact cause of hypertension during pregnancy is not known. You may be able to lower your risk by:  Maintaining a healthy weight.  Eating a healthy and balanced diet.  Following your health  care provider's instructions about treating any long-term conditions that you had before becoming pregnant. It is very important to keep all of your prenatal care appointments. Your health care provider will check your blood pressure and make  sure that your pregnancy is progressing as expected. If a problem is found, early treatment can prevent complications. How is this treated? Treatment for hypertension during pregnancy varies depending on the type of hypertension you have and how serious it is.  If you were taking medicine for high blood pressure before you became pregnant, talk with your health care provider. You may need to change medicine during pregnancy because some medicines, like ACE inhibitors, may not be considered safe for your baby.  If you have gestational hypertension, your health care provider may order medicine to treat this during pregnancy.  If you are at risk for preeclampsia, your health care provider may recommend that you take a low-dose aspirin during your pregnancy.  If you have severe hypertension, you may need to be hospitalized so you and your baby can be monitored closely. You may also need to be given medicine to lower your blood pressure. This medicine may be given by mouth or through an IV.  In some cases, if your condition gets worse, you may need to deliver your baby early. Follow these instructions at home: Eating and drinking   Drink enough fluid to keep your urine pale yellow.  Avoid caffeine. Lifestyle  Do not use any products that contain nicotine or tobacco, such as cigarettes, e-cigarettes, and chewing tobacco. If you need help quitting, ask your health care provider.  Do not use alcohol or drugs.  Avoid stress as much as possible.  Rest and get plenty of sleep.  Regular exercise can help to reduce your blood pressure. Ask your health care provider what kinds of exercise are best for you. General instructions  Take over-the-counter and prescription medicines only as told by your health care provider.  Keep all prenatal and follow-up visits as told by your health care provider. This is important. Contact a health care provider if:  You have symptoms that your health care  provider told you may require more treatment or monitoring, such as: ? Headaches. ? Nausea or vomiting. ? Abdominal pain. ? Dizziness. ? Light-headedness. Get help right away if:  You have: ? Severe abdominal pain that does not get better with treatment. ? A severe headache that does not get better. ? Vomiting that does not get better. ? Sudden, rapid weight gain. ? Sudden swelling in your hands, ankles, or face. ? Vaginal bleeding. ? Blood in your urine. ? Blurred or double vision. ? Shortness of breath or chest pain. ? Weakness on one side of your body. ? Difficulty speaking.  Your baby is not moving as much as usual. Summary  High blood pressure (hypertension) is when the force of blood pumping through the arteries is too strong.  Hypertension during pregnancy can cause problems for you and your baby.  Treatment for hypertension during pregnancy varies depending on the type of hypertension you have and how serious it is.  Keep all prenatal and follow-up visits as told by your health care provider. This is important. This information is not intended to replace advice given to you by your health care provider. Make sure you discuss any questions you have with your health care provider. Document Revised: 12/02/2018 Document Reviewed: 09/07/2018 Elsevier Patient Education  2020 [employer].

## 2020-07-30 NOTE — Progress Notes (Signed)
   PRENATAL VISIT NOTE  Subjective:  Courtney Torres is a 39 y.o. [id number] at [redacted]w[redacted]d being seen today for ongoing prenatal care.  She is currently monitored for the following issues for this high-risk pregnancy and has Generalized anxiety disorder; Supervision of high risk pregnancy, antepartum; Hx of preeclampsia, prior pregnancy, currently pregnant, unspecified trimester; History of cesarean section; History of preterm delivery, currently pregnant; Cystic fibrosis carrier; History of LEEP (loop electrosurgical excision procedure) of cervix complicating pregnancy in second trimester; Unwanted fertility; and Gestational hypertension without significant proteinuria on their problem list.  Patient reports cramping/pressure that is irregular but frequent at times, 5-6 times per hour with sharp vaginal pain with some of them.  Contractions: Not present. Vag. Bleeding: None.  Movement: Present. Denies leaking of fluid.   The following portions of the patient's history were reviewed and updated as appropriate: allergies, current medications, past family history, past medical history, past social history, past surgical history and problem list.   Objective:   Vitals:   07/30/20 1119  BP: (!) 143/84  Pulse: 91  Weight: 200 lb (90.7 kg)    Fetal Status: Fetal Heart Rate (bpm): 140 Fundal Height: 33 cm Movement: Present  Presentation: Vertex  General:  Alert, oriented and cooperative. Patient is in no acute distress.  Skin: Skin is warm and dry. No rash noted.   Cardiovascular: Normal heart rate noted  Respiratory: Normal respiratory effort, no problems with respiration noted  Abdomen: Soft, gravid, appropriate for gestational age.  Pain/Pressure: Present     Pelvic: Cervical exam performed in the presence of a chaperone Dilation: 1 Effacement (%): 30 Station: -3  Extremities: Normal range of motion.  Edema: Mild pitting, slight indentation  Mental Status: Normal mood and affect. Normal behavior. Normal  judgment and thought content.   Assessment and Plan:  Pregnancy: [id number] at [redacted]w[redacted]d 1. Supervision of high risk pregnancy, antepartum --Anticipatory guidance about next visits/weeks of pregnancy given.   2. Gestational hypertension without significant proteinuria in third trimester --BP elevated today, no s/sx of PEC -Reviewed s/sx of PEC, reasons to seek care --IOL scheduled at 37 weeks, orders placed  3. History of cesarean section --C/S x 1 then successful VBAC. Desires TOLAC, consent in chart.  4. History of preterm delivery, currently pregnant --On Makena  5. Unwanted fertility --Desires BTL, consent in chart  6. [redacted] weeks gestation of pregnancy   7. Preterm uterine contractions, antepartum, third trimester --Cervix 1/30/-3, vertex.   --FFN collected today prior to exam --PTL precautions/reasons to go to MAU reviewed  Preterm labor symptoms and general obstetric precautions including but not limited to vaginal bleeding, contractions, leaking of fluid and fetal movement were reviewed in detail with the patient. Please refer to After Visit Summary for other counseling recommendations.   Return in about 1 week (around 08/06/2020).  Future Appointments  Date Time Provider Department Center  07/31/2020  2:45 PM [address] [facility] NURSE [facility]  07/31/2020  3:00 PM [facility]  08/06/2020  9:15 AM Brock Bad, MD [facility] None    Sharen Counter, CNM

## 2020-07-30 NOTE — Progress Notes (Signed)
Patient presents for ROB. Patient denies having any headaches, visual changes, or epigastric pain. She does have some edema  in her ankles +1. Denies any other concerns.

## 2020-07-30 NOTE — Telephone Encounter (Signed)
Called to notify pt of negative FFN result today. Pt continued to have painful cramping so is checking in now in MAU for further evaluation.

## 2020-07-30 NOTE — MAU Provider Note (Signed)
History     CSN: [id number]  Arrival date and time: 07/30/20 1629   First Provider Initiated Contact with Patient 07/30/20 1722      Chief Complaint  Patient presents with  . Abdominal Pain  . pelvic pressure   Courtney Torres is a 39 y.o. year old G3P0202 female at [redacted]w[redacted]d weeks gestation who presents to MAU reporting was seen at [facility] today. She was checked by the midwife because she was complaining of pelvic pressure; cervix was dilated 1 cm/thick/-3/vtx. Once she got home from her appt, she continued to feel tightening in her abdomen. She denies VB or LOF. She is concerned that she continues to have contractions. She has been dx'd previously with gHTN and is scheduled for IOL on 08/23/2920.   OB History    Gravida  3   Para  2   Term      Preterm  2   AB      Living  2     SAB      TAB      Ectopic      Multiple      Live Births  2        Obstetric Comments  Pre-eclampsia with 1st, induced and then CS bc baby was breech        Past Medical History:  Diagnosis Date  . Abnormal Pap smear of cervix   . Anxiety   . Depression   . Pregnancy induced hypertension   . Preterm labor     Past Surgical History:  Procedure Laterality Date  . CERVICAL BIOPSY  W/ LOOP ELECTRODE EXCISION    . CESAREAN SECTION    . COLPOSCOPY    . TONSILLECTOMY AND ADENOIDECTOMY      Family History  Problem Relation Age of Onset  . Hypertension Father   . Arthritis Father   . Asthma Father   . COPD Father   . Heart failure Sister   . Early death Sister   . Heart disease Sister   . Hypertension Brother   . Alcohol abuse Brother   . Drug abuse Brother   . Hypertension Paternal Grandfather   . Anxiety disorder Mother   . Arthritis Mother   . Depression Mother     Social History   Tobacco Use  . Smoking status: Former Smoker    Packs/day: 0.50    Types: Cigarettes  . Smokeless tobacco: Never Used  Vaping Use  . Vaping Use: Never used  Substance Use Topics   . Alcohol use: Not Currently    Comment: occ  . Drug use: No    Allergies: No Known Allergies  Medications Prior to Admission  Medication Sig Dispense Refill Last Dose  . aspirin EC 81 MG tablet Take 1 tablet (81 mg total) by mouth daily. Swallow whole. 30 tablet 11   . Prenatal Vit-Fe Fumarate-FA (PRENATAL MULTIVITAMIN) TABS tablet Take 1 tablet by mouth daily at 12 noon.       Review of Systems  Constitutional: Negative.   HENT: Negative.   Eyes: Negative.   Respiratory: Negative.   Cardiovascular: Positive for leg swelling.  Endocrine: Negative.   Genitourinary: Positive for pelvic pain (contractions).  Musculoskeletal: Negative.        Hands swollen  Skin: Negative.   Allergic/Immunologic: Negative.   Neurological: Negative.   Hematological: Negative.   Psychiatric/Behavioral: Negative.    Physical Exam   Patient Vitals for the past 24 hrs:  BP Temp Temp src  Pulse Resp SpO2 Height Weight  07/30/20 2011 (!) 146/94 -- -- 76 -- -- -- --  07/30/20 2001 (!) 141/98 -- -- 92 -- -- -- --  07/30/20 1951 139/89 -- -- 75 -- -- -- --  07/30/20 1948 (!) 150/100 -- -- 75 -- -- -- --  07/30/20 1923 -- -- -- -- -- 97 % -- --  07/30/20 1920 -- -- -- -- -- 97 % -- --  07/30/20 1915 -- -- -- -- -- 97 % -- --  07/30/20 1910 -- -- -- -- -- 98 % -- --  07/30/20 1905 -- -- -- -- -- 98 % -- --  07/30/20 1900 -- -- -- -- -- 98 % -- --  07/30/20 1855 -- -- -- -- -- 98 % -- --  07/30/20 1841 (!) 142/93 -- -- 83 -- -- -- --  07/30/20 1831 (!) 128/91 -- -- (!) 105 -- -- -- --  07/30/20 1811 (!) 134/95 -- -- (!) 102 -- 96 % -- --  07/30/20 1801 (!) 143/96 -- -- 86 -- -- -- --  07/30/20 1740 (!) 144/94 -- -- 84 -- 99 % -- --  07/30/20 1729 (!) 155/104 -- -- 96 -- -- -- --  07/30/20 1725 -- -- -- -- -- 100 % -- --  07/30/20 1716 (!) 169/110 -- -- (!) 107 -- -- -- --  07/30/20 1713 (!) 164/110 -- -- 100 -- -- -- --  07/30/20 1710 -- -- -- -- -- 99 % -- --  07/30/20 1651 (!) 160/104 98.7 F  (37.1 C) Oral (!) 107 18 97 % 5\' 4"  (1.626 m) 90.8 kg    Physical Exam Vitals and nursing note reviewed. Exam conducted with a chaperone present.  Constitutional:      Appearance: Normal appearance. She is normal weight.  HENT:     Head: Normocephalic and atraumatic.  Cardiovascular:     Pulses: Normal pulses.  Pulmonary:     Effort: Pulmonary effort is normal.  Abdominal:     Palpations: Abdomen is soft.  Genitourinary:    Comments: deferred Musculoskeletal:     Right lower leg: Edema present.     Left lower leg: Edema present.     Comments: Bilateral edema of hands  Skin:    General: Skin is warm and dry.  Neurological:     Mental Status: She is alert and oriented to person, place, and time.  Psychiatric:        Mood and Affect: Mood normal.        Behavior: Behavior normal.        Thought Content: Thought content normal.        Judgment: Judgment normal.    REACTIVE NST - FHR: 140 bpm / moderate variability / accels present / decels absent / TOCO: regular every 3-4 mins; none since 1730  MAU Course  Procedures  MDM CCUA CBC CMP P/C Ratio Serial BP's  BMZ 12 mg IM Labetalol Protocol  LR 1000 ml @ 125 ml/hr    *Consult with Dr. @ [phone] - notified of patient's complaints, assessments, lab & NST results, recommended tx plan admission for observation  TC to Dr. [id number] @ 1958 - notified of patient's complaints, assessments, lab & NST results, advised of Dr. 1959 recommended tx plan - agrees with plan   Results for orders placed or performed during the hospital encounter of 07/30/20 (from the past 24 hour(s))  CBC     Status: Abnormal  Collection Time: 07/30/20  5:28 PM  Result Value Ref Range   WBC 11.8 (H) 4.0 - 10.5 K/uL   RBC 3.95 3.87 - 5.11 MIL/uL   Hemoglobin 12.7 12.0 - 15.0 g/dL   HCT 81.8 36 - 46 %   MCV 91.9 80.0 - 100.0 fL   MCH 32.2 26.0 - 34.0 pg   MCHC 35.0 30.0 - 36.0 g/dL   RDW 56.3 14.9 - 70.2 %   Platelets 231 150 - 400 K/uL    nRBC 0.0 0.0 - 0.2 %  Comprehensive metabolic panel     Status: Abnormal   Collection Time: 07/30/20  5:28 PM  Result Value Ref Range   Sodium 135 135 - 145 mmol/L   Potassium 3.6 3.5 - 5.1 mmol/L   Chloride 105 98 - 111 mmol/L   CO2 19 (L) 22 - 32 mmol/L   Glucose, Bld 105 (H) 70 - 99 mg/dL   BUN 6 6 - 20 mg/dL   Creatinine, Ser 6.37 0.44 - 1.00 mg/dL   Calcium 9.1 8.9 - 85.8 mg/dL   Total Protein 6.4 (L) 6.5 - 8.1 g/dL   Albumin 3.0 (L) 3.5 - 5.0 g/dL   AST 20 15 - 41 U/L   ALT 16 0 - 44 U/L   Alkaline Phosphatase 123 38 - 126 U/L   Total Bilirubin 0.2 (L) 0.3 - 1.2 mg/dL   GFR, Estimated >85 >02 mL/min   Anion gap 11 5 - 15  Protein / creatinine ratio, urine     Status: None   Collection Time: 07/30/20  5:28 PM  Result Value Ref Range   Creatinine, Urine 25.57 mg/dL   Total Protein, Urine <6 mg/dL   Protein Creatinine Ratio RESULT BELOW REPORTABLE RANGE,  UNABLE TO CALCULATE.       0.00 - 0.15 mg/mg[Cre]    Assessment and Plan  Gestational hypertension without significant proteinuria in third trimester  - Admit to OBSCU for observation - Will receive 2nd BMZ injection tomorrow - Dr. Donavan Foil to document H&P    Raelyn Mora, CNM 07/30/2020, 5:22 PM

## 2020-07-30 NOTE — MAU Note (Signed)
Had appt earlier this morning, midwife checked her because she was having pressure.  Was 1 cm.  Since she got home she has been feeling a lot of tightening in her stomach, not painful.  Denies bleeding or leaking.

## 2020-07-31 ENCOUNTER — Observation Stay (HOSPITAL_BASED_OUTPATIENT_CLINIC_OR_DEPARTMENT_OTHER): Payer: Medicaid Other

## 2020-07-31 ENCOUNTER — Ambulatory Visit: Payer: Medicaid Other

## 2020-07-31 ENCOUNTER — Other Ambulatory Visit: Payer: Self-pay | Admitting: *Deleted

## 2020-07-31 DIAGNOSIS — O133 Gestational [pregnancy-induced] hypertension without significant proteinuria, third trimester: Secondary | ICD-10-CM

## 2020-07-31 DIAGNOSIS — Z9289 Personal history of other medical treatment: Secondary | ICD-10-CM

## 2020-07-31 DIAGNOSIS — Z3A32 32 weeks gestation of pregnancy: Secondary | ICD-10-CM | POA: Diagnosis not present

## 2020-07-31 DIAGNOSIS — O09293 Supervision of pregnancy with other poor reproductive or obstetric history, third trimester: Secondary | ICD-10-CM | POA: Diagnosis not present

## 2020-07-31 DIAGNOSIS — O4703 False labor before 37 completed weeks of gestation, third trimester: Secondary | ICD-10-CM | POA: Diagnosis not present

## 2020-07-31 DIAGNOSIS — Z3A33 33 weeks gestation of pregnancy: Secondary | ICD-10-CM | POA: Diagnosis not present

## 2020-07-31 DIAGNOSIS — O34219 Maternal care for unspecified type scar from previous cesarean delivery: Secondary | ICD-10-CM

## 2020-07-31 DIAGNOSIS — O321XX0 Maternal care for breech presentation, not applicable or unspecified: Secondary | ICD-10-CM | POA: Diagnosis not present

## 2020-07-31 DIAGNOSIS — O09523 Supervision of elderly multigravida, third trimester: Secondary | ICD-10-CM | POA: Diagnosis not present

## 2020-07-31 DIAGNOSIS — O09213 Supervision of pregnancy with history of pre-term labor, third trimester: Secondary | ICD-10-CM

## 2020-07-31 DIAGNOSIS — O3443 Maternal care for other abnormalities of cervix, third trimester: Secondary | ICD-10-CM

## 2020-07-31 DIAGNOSIS — Z141 Cystic fibrosis carrier: Secondary | ICD-10-CM

## 2020-07-31 LAB — COMPREHENSIVE METABOLIC PANEL
ALT: 16 U/L (ref 0–44)
AST: 19 U/L (ref 15–41)
Albumin: 2.9 g/dL — ABNORMAL LOW (ref 3.5–5.0)
Alkaline Phosphatase: 116 U/L (ref 38–126)
Anion gap: 11 (ref 5–15)
BUN: <5 mg/dL — ABNORMAL LOW (ref 6–20)
CO2: 19 mmol/L — ABNORMAL LOW (ref 22–32)
Calcium: 9.5 mg/dL (ref 8.9–10.3)
Chloride: 106 mmol/L (ref 98–111)
Creatinine, Ser: 0.6 mg/dL (ref 0.44–1.00)
GFR, Estimated: >60 mL/min (ref >60)
Glucose, Bld: 134 mg/dL — ABNORMAL HIGH (ref 70–99)
Potassium: 4.2 mmol/L (ref 3.5–5.1)
Sodium: 136 mmol/L (ref 135–145)
Total Bilirubin: 0.6 mg/dL (ref 0.3–1.2)
Total Protein: 6.2 g/dL — ABNORMAL LOW (ref 6.5–8.1)

## 2020-07-31 LAB — CBC
HCT: 36.5 % (ref 36.0–46.0)
Hemoglobin: 12.2 g/dL (ref 12.0–15.0)
MCH: 31 pg (ref 26.0–34.0)
MCHC: 33.4 g/dL (ref 30.0–36.0)
MCV: 92.6 fL (ref 80.0–100.0)
Platelets: 229 10*3/uL (ref 150–400)
RBC: 3.94 MIL/uL (ref 3.87–5.11)
RDW: 13.1 % (ref 11.5–15.5)
WBC: 14.6 10*3/uL — ABNORMAL HIGH (ref 4.0–10.5)
nRBC: 0 % (ref 0.0–0.2)

## 2020-07-31 MED ORDER — BETAMETHASONE SOD PHOS & ACET 6 (3-3) MG/ML IJ SUSP
12.0000 mg | Freq: Once | INTRAMUSCULAR | Status: AC
  Administered 2020-07-31: 12 mg via INTRAMUSCULAR

## 2020-07-31 NOTE — Progress Notes (Signed)
I have reviewed and concur with this student's documentation.   

## 2020-07-31 NOTE — Discharge Instructions (Signed)

## 2020-07-31 NOTE — H&P (Signed)
FACULTY PRACTICE ANTEPARTUM ADMISSION HISTORY AND PHYSICAL NOTE   History of Present Illness: Courtney Torres is a [age] y.o. [id number] at 6064w5d admitted for increased uterine activity and elevated blood pressure.   Per pt she noted much increased uterine activity after she left her prenatal visit.  She states the pressure and tightening did not feel like labor contractions.  During her evaluation with the midwife, her cervix was 1/thick/-3.  She also had elevated blood pressure while being evaluated.  PIH labs were WNL, but the patient was kept for observation and steroid course.  Today she does note positive fetal movement, no loss of fluid/VB.  She notes decreased uterine activity.  The patient denies headache, visual changes and RUQ pain. Fetal presentation is breech.  Patient Active Problem List   Diagnosis Date Noted  . Gestational hypertension 07/30/2020  . Gestational hypertension without significant proteinuria 07/25/2020  . Unwanted fertility 04/27/2020  . History of LEEP (loop electrosurgical excision procedure) of cervix complicating pregnancy in second trimester 03/30/2020  . 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 Hx of preeclampsia, prior pregnancy, currently pregnant, unspecified trimester 02/17/2020  . History of cesarean section 02/17/2020  . History of preterm delivery, currently pregnant 02/17/2020  . Supervision of high risk pregnancy, antepartum 01/12/2020  . Generalized anxiety disorder 06/09/2019    Past Medical History:  Diagnosis Date  . Abnormal Pap smear of cervix   . Anxiety   . Depression   . Pregnancy induced hypertension   . Preterm labor     Past Surgical History:  Procedure Laterality Date  . CERVICAL BIOPSY  W/ LOOP ELECTRODE EXCISION    . CESAREAN SECTION    . COLPOSCOPY    . TONSILLECTOMY AND ADENOIDECTOMY      OB History  Gravida Para Term Preterm AB Living  3 2   2   2   SAB TAB Ectopic Multiple Live Births          2    # Outcome Date GA  Lbr Len/2nd Weight Sex Delivery Anes PTL Lv  3 Current           2 Preterm 01/04/13 3560w0d  2098 g M Vag-Spont   LIV  1 Preterm 05/06/02 6152w5d  3572 g F CS-LTranv  Y LIV    Obstetric Comments  Pre-eclampsia with 1st, induced and then CS bc baby was breech    Social History   Socioeconomic History  . Marital status: Married    Spouse name: Not on file  . Number of children: 2  . Years of education: Not on file  . Highest education level: Not on file  Occupational History  . Occupation: unemployed  Tobacco Use  . Smoking status: Former Smoker    Packs/day: 0.50    Types: Cigarettes  . Smokeless tobacco: Never Used  Vaping Use  . Vaping Use: Never used  Substance and Sexual Activity  . Alcohol use: Not Currently    Comment: occ  . Drug use: No  . Sexual activity: Yes  Other Topics Concern  . Not on file  Social History Narrative  . Not on file   Social Determinants of Health   Financial Resource Strain:   . Difficulty of Paying Living Expenses: Not on file  Food Insecurity:   . Worried About Programme researcher, broadcasting/film/videounning Out of Food in the Last Year: Not on file  . Ran Out of Food in the Last Year: Not on file  Transportation Needs:   . Lack of Transportation (  Medical): Not on file  . Lack of Transportation (Non-Medical): Not on file  Physical Activity:   . Days of Exercise per Week: Not on file  . Minutes of Exercise per Session: Not on file  Stress:   . Feeling of Stress : Not on file  Social Connections:   . Frequency of Communication with Friends and Family: Not on file  . Frequency of Social Gatherings with Friends and Family: Not on file  . Attends Religious Services: Not on file  . Active Member of Clubs or Organizations: Not on file  . Attends Banker Meetings: Not on file  . Marital Status: Not on file    Family History  Problem Relation Age of Onset  . Hypertension Father   . Arthritis Father   . Asthma Father   . COPD Father   . Heart failure Sister    . Early death Sister   . Heart disease Sister   . Hypertension Brother   . Alcohol abuse Brother   . Drug abuse Brother   . Hypertension Paternal Grandfather   . Anxiety disorder Mother   . Arthritis Mother   . Depression Mother     No Known Allergies  Medications Prior to Admission  Medication Sig Dispense Refill Last Dose  . aspirin EC 81 MG tablet Take 1 tablet (81 mg total) by mouth daily. Swallow whole. 30 tablet 11   . Prenatal Vit-Fe Fumarate-FA (PRENATAL MULTIVITAMIN) TABS tablet Take 1 tablet by mouth daily at 12 noon.       Review of Systems - negative  Vitals:  BP 130/88 (BP Location: Right Arm)   Pulse 99   Temp 97.7 F (36.5 C) (Oral)   Resp 20   Ht 5\' 4"  (1.626 m)   Wt 90.8 kg   LMP 12/08/2019 (Exact Date)   SpO2 99%   BMI 34.36 kg/m  Physical Examination: CONSTITUTIONAL: Well-developed, well-nourished female in no acute distress.  HENT:  Normocephalic, atraumatic, External right and left ear normal. Oropharynx is clear and moist EYES: Conjunctivae and EOM are normal. Pupils are equal, round, and reactive to light. No scleral icterus.  NECK: Normal range of motion, supple, no masses SKIN: Skin is warm and dry. No rash noted. Not diaphoretic. No erythema. No pallor. NEUROLGIC: Alert and oriented to person, place, and time. Normal reflexes, muscle tone coordination. No cranial nerve deficit noted. PSYCHIATRIC: Normal mood and affect. Normal behavior. Normal judgment and thought content. CARDIOVASCULAR: Normal heart rate noted, regular rhythm RESPIRATORY: Effort and breath sounds normal, no problems with respiration noted ABDOMEN: Soft, nontender, nondistended, gravid. MUSCULOSKELETAL: Normal range of motion. No edema and no tenderness. 2+ distal pulses.  Cervix: previously evaluated and found to be 1 cm/ thick/-3 and fetal presentation is breech. Membranes:intact Fetal Monitoring:Baseline: 135 bpm, Variability: Good {> 6 bpm), Accelerations: Reactive and  Decelerations: Absent Tocometer: irritability  Labs:  Results for orders placed or performed during the hospital encounter of 07/30/20 (from the past 24 hour(s))  Type and screen [facility]   Collection Time: 07/30/20  5:25 PM  Result Value Ref Range   ABO/RH(D) B POS    Antibody Screen NEG    Sample Expiration      08/02/2020,2359 Performed at [facility], [address], [place], [place]   CBC   Collection Time: 07/30/20  5:28 PM  Result Value Ref Range   WBC 11.8 (H) 4.0 - 10.5 K/uL   RBC 3.95 3.87 -  5.11 MIL/uL   Hemoglobin 12.7 12.0 - 15.0 g/dL   HCT 84.6 36 - 46 %   MCV 91.9 80.0 - 100.0 fL   MCH 32.2 26.0 - 34.0 pg   MCHC 35.0 30.0 - 36.0 g/dL   RDW 65.9 93.5 - 70.1 %   Platelets 231 150 - 400 K/uL   nRBC 0.0 0.0 - 0.2 %  Comprehensive metabolic panel   Collection Time: 07/30/20  5:28 PM  Result Value Ref Range   Sodium 135 135 - 145 mmol/L   Potassium 3.6 3.5 - 5.1 mmol/L   Chloride 105 98 - 111 mmol/L   CO2 19 (L) 22 - 32 mmol/L   Glucose, Bld 105 (H) 70 - 99 mg/dL   BUN 6 6 - 20 mg/dL   Creatinine, Ser 7.79 0.44 - 1.00 mg/dL   Calcium 9.1 8.9 - 39.0 mg/dL   Total Protein 6.4 (L) 6.5 - 8.1 g/dL   Albumin 3.0 (L) 3.5 - 5.0 g/dL   AST 20 15 - 41 U/L   ALT 16 0 - 44 U/L   Alkaline Phosphatase 123 38 - 126 U/L   Total Bilirubin 0.2 (L) 0.3 - 1.2 mg/dL   GFR, Estimated >30 >09 mL/min   Anion gap 11 5 - 15  Protein / creatinine ratio, urine   Collection Time: 07/30/20  5:28 PM  Result Value Ref Range   Creatinine, Urine 25.57 mg/dL   Total Protein, Urine <6 mg/dL   Protein Creatinine Ratio        0.00 - 0.15 mg/mg[Cre]  Resp Panel by RT-PCR (Flu A&B, Covid) Nasopharyngeal Swab   Collection Time: 07/30/20  8:16 PM   Specimen: Nasopharyngeal Swab; Nasopharyngeal(NP) swabs in vial transport medium  Result Value Ref Range   SARS Coronavirus 2 by RT PCR NEGATIVE NEGATIVE   Influenza A by PCR NEGATIVE NEGATIVE   Influenza B by  PCR NEGATIVE NEGATIVE  CBC   Collection Time: 07/31/20  5:56 AM  Result Value Ref Range   WBC 14.6 (H) 4.0 - 10.5 K/uL   RBC 3.94 3.87 - 5.11 MIL/uL   Hemoglobin 12.2 12.0 - 15.0 g/dL   HCT 23.3 36 - 46 %   MCV 92.6 80.0 - 100.0 fL   MCH 31.0 26.0 - 34.0 pg   MCHC 33.4 30.0 - 36.0 g/dL   RDW 00.7 62.2 - 63.3 %   Platelets 229 150 - 400 K/uL   nRBC 0.0 0.0 - 0.2 %  Comprehensive metabolic panel   Collection Time: 07/31/20  5:56 AM  Result Value Ref Range   Sodium 136 135 - 145 mmol/L   Potassium 4.2 3.5 - 5.1 mmol/L   Chloride 106 98 - 111 mmol/L   CO2 19 (L) 22 - 32 mmol/L   Glucose, Bld 134 (H) 70 - 99 mg/dL   BUN <5 (L) 6 - 20 mg/dL   Creatinine, Ser 3.54 0.44 - 1.00 mg/dL   Calcium 9.5 8.9 - 56.2 mg/dL   Total Protein 6.2 (L) 6.5 - 8.1 g/dL   Albumin 2.9 (L) 3.5 - 5.0 g/dL   AST 19 15 - 41 U/L   ALT 16 0 - 44 U/L   Alkaline Phosphatase 116 38 - 126 U/L   Total Bilirubin 0.6 0.3 - 1.2 mg/dL   GFR, Estimated >56 >38 mL/min   Anion gap 11 5 - 15    Imaging Studies: No results found.   Assessment and Plan: Patient Active Problem List   Diagnosis Date  Noted  . Gestational hypertension 07/30/2020  . Gestational hypertension without significant proteinuria 07/25/2020  . Unwanted fertility 04/27/2020  . History of LEEP (loop electrosurgical excision procedure) of cervix complicating pregnancy in second trimester 03/30/2020  . 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 Hx of preeclampsia, prior pregnancy, currently pregnant, unspecified trimester 02/17/2020  . History of cesarean section 02/17/2020  . History of preterm delivery, currently pregnant 02/17/2020  . Supervision of high risk pregnancy, antepartum 01/12/2020  . Generalized anxiety disorder 06/09/2019   Admit to Antenatal Serial BP Betamethasone x 2 doses. 2nd dose this afternoon at 1730  Routine antenatal care  Mariel Aloe, MD Faculty Attending, [facility]

## 2020-07-31 NOTE — Plan of Care (Signed)

## 2020-07-31 NOTE — Discharge Summary (Signed)
Physician Discharge Summary  Patient ID: Courtney Torres MRN: [id number] DOB/AGE: [birth year] 39 y.o.  Admit date: 07/30/2020 Discharge date: 07/31/2020  Admission Diagnoses:  Discharge Diagnoses:  Active Problems:   Gestational hypertension   Discharged Condition: good  Hospital Course: Patient is a 39 yo G3P0202 at [redacted]w[redacted]d with gestational HTN admitted for observation secondary to elevated BP. Throughout her hospitalization her blood pressure returned to baseline. She remained asymptomatic and all lab values were normal including protein creatine ratio. BPP 8/8 and fetal heart tracing remained reactive. Patient received 2 doses of BMZ and was found stable for discharge. Discharge instructions were reviewed with the patient and sign/symptoms of preeclampsia were discussed. Patient is scheduled to follow up next week for routine prenatal care   Discharge Exam: Blood pressure 121/80, pulse 86, temperature (!) 97.5 F (36.4 C), temperature source Oral, resp. rate 18, height 5\' 4"  (1.626 m), weight 90.8 kg, last menstrual period 12/08/2019, SpO2 99 %. General appearance: alert, cooperative and no distress Resp: clear to auscultation bilaterally Cardio: regular rate and rhythm GI: soft, gravid, non tender Extremities: extremities normal, atraumatic, no cyanosis or edema  Disposition: Home There are no questions and answers to display.         Allergies as of 07/31/2020   No Known Allergies     Medication List    TAKE these medications   acetaminophen 500 MG tablet Commonly known as: TYLENOL Take 500 mg by mouth every 6 (six) hours as needed for mild pain.   aspirin EC 81 MG tablet Take 1 tablet (81 mg total) by mouth daily. Swallow whole.   doxylamine (Sleep) 25 MG tablet Commonly known as: UNISOM Take 50 mg by mouth at bedtime as needed for sleep.   prenatal multivitamin Tabs tablet Take 1 tablet by mouth daily at 12 noon.       Follow-up Information    [facility]  CENTER Follow up.   Why: As scheduled for routine prenatal care Contact information: [address] [place] [id number]              Signed: [phone] 07/31/2020, 4:54 PM

## 2020-08-06 ENCOUNTER — Other Ambulatory Visit: Payer: Self-pay

## 2020-08-06 ENCOUNTER — Encounter: Payer: Self-pay | Admitting: Obstetrics

## 2020-08-06 ENCOUNTER — Telehealth: Payer: Self-pay

## 2020-08-06 ENCOUNTER — Ambulatory Visit (INDEPENDENT_AMBULATORY_CARE_PROVIDER_SITE_OTHER): Payer: Medicaid Other | Admitting: Obstetrics

## 2020-08-06 VITALS — BP 133/94 | HR 118 | Wt 202.3 lb

## 2020-08-06 DIAGNOSIS — O0990 Supervision of high risk pregnancy, unspecified, unspecified trimester: Secondary | ICD-10-CM

## 2020-08-06 DIAGNOSIS — O09899 Supervision of other high risk pregnancies, unspecified trimester: Secondary | ICD-10-CM

## 2020-08-06 DIAGNOSIS — O133 Gestational [pregnancy-induced] hypertension without significant proteinuria, third trimester: Secondary | ICD-10-CM

## 2020-08-06 DIAGNOSIS — Z98891 History of uterine scar from previous surgery: Secondary | ICD-10-CM

## 2020-08-06 NOTE — Progress Notes (Signed)
Subjective:  Courtney Torres is a 39 y.o. [id number] at [redacted]w[redacted]d being seen today for ongoing prenatal care.  She is currently monitored for the following issues for this high-risk pregnancy and has Generalized anxiety disorder; Supervision of high risk pregnancy, antepartum; Hx of preeclampsia, prior pregnancy, currently pregnant, unspecified trimester; History of cesarean section; History of preterm delivery, currently pregnant; Cystic fibrosis carrier; History of LEEP (loop electrosurgical excision procedure) of cervix complicating pregnancy in second trimester; Unwanted fertility; Gestational hypertension without significant proteinuria; and Gestational hypertension on their problem list.  Patient reports backache and occasional contractions.  Contractions: Irregular. Vag. Bleeding: None.  Movement: Present. Denies leaking of fluid.   The following portions of the patient's history were reviewed and updated as appropriate: allergies, current medications, past family history, past medical history, past social history, past surgical history and problem list. Problem list updated.  Objective:   Vitals:   08/06/20 0932  BP: (!) 133/94  Pulse: (!) 118  Weight: 202 lb 4.8 oz (91.8 kg)    Fetal Status:     Movement: Present     General:  Alert, oriented and cooperative. Patient is in no acute distress.  Skin: Skin is warm and dry. No rash noted.   Cardiovascular: Normal heart rate noted  Respiratory: Normal respiratory effort, no problems with respiration noted  Abdomen: Soft, gravid, appropriate for gestational age. Pain/Pressure: Present     Pelvic:  Cervical exam deferred        Extremities: Normal range of motion.  Edema: Mild pitting, slight indentation  Mental Status: Normal mood and affect. Normal behavior. Normal judgment and thought content.   Urinalysis:      Assessment and Plan:  Pregnancy: [id number] at [redacted]w[redacted]d  1. Supervision of high risk pregnancy, antepartum  2. History of preterm  delivery, currently pregnant  3. History of cesarean section - desires TOLAC - desires tubal ligation  4. Gestational hypertension without significant proteinuria in third trimester - clinically stable BP's   There are no diagnoses linked to this encounter. Preterm labor symptoms and general obstetric precautions including but not limited to vaginal bleeding, contractions, leaking of fluid and fetal movement were reviewed in detail with the patient. Please refer to After Visit Summary for other counseling recommendations.   Return in about 2 weeks (around 08/20/2020) for ROB.   Brock Bad, MD  08/06/20

## 2020-08-06 NOTE — Telephone Encounter (Signed)
Spoke with Courtney Torres at [facility] will have AVS printed for patient, so that patient is aware of her ultrasound appointments in our office.  Patient had a OB appt. In their office on 08/06/20

## 2020-08-07 ENCOUNTER — Ambulatory Visit: Payer: Medicaid Other | Admitting: *Deleted

## 2020-08-07 ENCOUNTER — Ambulatory Visit (HOSPITAL_BASED_OUTPATIENT_CLINIC_OR_DEPARTMENT_OTHER): Payer: Medicaid Other

## 2020-08-07 ENCOUNTER — Encounter: Payer: Self-pay | Admitting: *Deleted

## 2020-08-07 VITALS — BP 124/83 | HR 105

## 2020-08-07 DIAGNOSIS — O3443 Maternal care for other abnormalities of cervix, third trimester: Secondary | ICD-10-CM

## 2020-08-07 DIAGNOSIS — Z3A34 34 weeks gestation of pregnancy: Secondary | ICD-10-CM

## 2020-08-07 DIAGNOSIS — O09213 Supervision of pregnancy with history of pre-term labor, third trimester: Secondary | ICD-10-CM

## 2020-08-07 DIAGNOSIS — O09893 Supervision of other high risk pregnancies, third trimester: Secondary | ICD-10-CM

## 2020-08-07 DIAGNOSIS — O09293 Supervision of pregnancy with other poor reproductive or obstetric history, third trimester: Secondary | ICD-10-CM

## 2020-08-07 DIAGNOSIS — R030 Elevated blood-pressure reading, without diagnosis of hypertension: Secondary | ICD-10-CM

## 2020-08-07 DIAGNOSIS — O09523 Supervision of elderly multigravida, third trimester: Secondary | ICD-10-CM

## 2020-08-07 DIAGNOSIS — O133 Gestational [pregnancy-induced] hypertension without significant proteinuria, third trimester: Secondary | ICD-10-CM | POA: Diagnosis not present

## 2020-08-07 DIAGNOSIS — O34219 Maternal care for unspecified type scar from previous cesarean delivery: Secondary | ICD-10-CM

## 2020-08-10 ENCOUNTER — Telehealth (HOSPITAL_COMMUNITY): Payer: Self-pay | Admitting: *Deleted

## 2020-08-10 ENCOUNTER — Encounter (HOSPITAL_COMMUNITY): Payer: Self-pay | Admitting: *Deleted

## 2020-08-10 NOTE — Telephone Encounter (Signed)
Preadmission screen  

## 2020-08-15 ENCOUNTER — Other Ambulatory Visit: Payer: Self-pay

## 2020-08-15 ENCOUNTER — Encounter (HOSPITAL_COMMUNITY): Payer: Self-pay | Admitting: Obstetrics and Gynecology

## 2020-08-15 ENCOUNTER — Encounter: Payer: Self-pay | Admitting: *Deleted

## 2020-08-15 ENCOUNTER — Inpatient Hospital Stay (HOSPITAL_COMMUNITY): Payer: Medicaid Other | Admitting: Anesthesiology

## 2020-08-15 ENCOUNTER — Encounter: Payer: Medicaid Other | Admitting: Obstetrics

## 2020-08-15 ENCOUNTER — Inpatient Hospital Stay (HOSPITAL_COMMUNITY)
Admission: AD | Admit: 2020-08-15 | Discharge: 2020-08-18 | DRG: 798 | Disposition: A | Discharge status: Home / Self Care | Payer: Medicaid Other | Attending: Obstetrics and Gynecology | Admitting: Obstetrics and Gynecology

## 2020-08-15 ENCOUNTER — Ambulatory Visit (HOSPITAL_BASED_OUTPATIENT_CLINIC_OR_DEPARTMENT_OTHER): Payer: Medicaid Other

## 2020-08-15 ENCOUNTER — Ambulatory Visit: Payer: Medicaid Other | Admitting: *Deleted

## 2020-08-15 VITALS — BP 154/106 | HR 103

## 2020-08-15 DIAGNOSIS — Z3A36 36 weeks gestation of pregnancy: Secondary | ICD-10-CM

## 2020-08-15 DIAGNOSIS — O1414 Severe pre-eclampsia complicating childbirth: Principal | ICD-10-CM | POA: Diagnosis present

## 2020-08-15 DIAGNOSIS — Z87891 Personal history of nicotine dependence: Secondary | ICD-10-CM | POA: Diagnosis not present

## 2020-08-15 DIAGNOSIS — O09293 Supervision of pregnancy with other poor reproductive or obstetric history, third trimester: Secondary | ICD-10-CM

## 2020-08-15 DIAGNOSIS — O133 Gestational [pregnancy-induced] hypertension without significant proteinuria, third trimester: Secondary | ICD-10-CM | POA: Insufficient documentation

## 2020-08-15 DIAGNOSIS — O139 Gestational [pregnancy-induced] hypertension without significant proteinuria, unspecified trimester: Secondary | ICD-10-CM | POA: Diagnosis present

## 2020-08-15 DIAGNOSIS — O09523 Supervision of elderly multigravida, third trimester: Secondary | ICD-10-CM

## 2020-08-15 DIAGNOSIS — O3443 Maternal care for other abnormalities of cervix, third trimester: Secondary | ICD-10-CM | POA: Diagnosis not present

## 2020-08-15 DIAGNOSIS — Z9889 Other specified postprocedural states: Secondary | ICD-10-CM | POA: Diagnosis not present

## 2020-08-15 DIAGNOSIS — O09213 Supervision of pregnancy with history of pre-term labor, third trimester: Secondary | ICD-10-CM

## 2020-08-15 DIAGNOSIS — O42013 Preterm premature rupture of membranes, onset of labor within 24 hours of rupture, third trimester: Secondary | ICD-10-CM | POA: Diagnosis not present

## 2020-08-15 DIAGNOSIS — O134 Gestational [pregnancy-induced] hypertension without significant proteinuria, complicating childbirth: Secondary | ICD-10-CM | POA: Diagnosis present

## 2020-08-15 DIAGNOSIS — Z20822 Contact with and (suspected) exposure to covid-19: Secondary | ICD-10-CM | POA: Diagnosis present

## 2020-08-15 DIAGNOSIS — Z9851 Tubal ligation status: Secondary | ICD-10-CM | POA: Diagnosis not present

## 2020-08-15 DIAGNOSIS — Z302 Encounter for sterilization: Secondary | ICD-10-CM | POA: Diagnosis not present

## 2020-08-15 DIAGNOSIS — Z3A35 35 weeks gestation of pregnancy: Secondary | ICD-10-CM

## 2020-08-15 DIAGNOSIS — O1494 Unspecified pre-eclampsia, complicating childbirth: Secondary | ICD-10-CM | POA: Diagnosis present

## 2020-08-15 DIAGNOSIS — O0990 Supervision of high risk pregnancy, unspecified, unspecified trimester: Secondary | ICD-10-CM

## 2020-08-15 DIAGNOSIS — O99824 Streptococcus B carrier state complicating childbirth: Secondary | ICD-10-CM | POA: Diagnosis not present

## 2020-08-15 DIAGNOSIS — O34219 Maternal care for unspecified type scar from previous cesarean delivery: Secondary | ICD-10-CM

## 2020-08-15 DIAGNOSIS — O09893 Supervision of other high risk pregnancies, third trimester: Secondary | ICD-10-CM

## 2020-08-15 DIAGNOSIS — O34211 Maternal care for low transverse scar from previous cesarean delivery: Secondary | ICD-10-CM | POA: Diagnosis not present

## 2020-08-15 LAB — COMPREHENSIVE METABOLIC PANEL
ALT: 11 U/L (ref 0–44)
AST: 17 U/L (ref 15–41)
Albumin: 2.8 g/dL — ABNORMAL LOW (ref 3.5–5.0)
Alkaline Phosphatase: 139 U/L — ABNORMAL HIGH (ref 38–126)
Anion gap: 8 (ref 5–15)
BUN: 6 mg/dL (ref 6–20)
CO2: 20 mmol/L — ABNORMAL LOW (ref 22–32)
Calcium: 8.9 mg/dL (ref 8.9–10.3)
Chloride: 106 mmol/L (ref 98–111)
Creatinine, Ser: 0.64 mg/dL (ref 0.44–1.00)
GFR, Estimated: >60 mL/min (ref >60)
Glucose, Bld: 92 mg/dL (ref 70–99)
Potassium: 3.8 mmol/L (ref 3.5–5.1)
Sodium: 134 mmol/L — ABNORMAL LOW (ref 135–145)
Total Bilirubin: 0.6 mg/dL (ref 0.3–1.2)
Total Protein: 6.2 g/dL — ABNORMAL LOW (ref 6.5–8.1)

## 2020-08-15 LAB — RESP PANEL BY RT-PCR (FLU A&B, COVID) ARPGX2
Influenza A by PCR: NEGATIVE
Influenza B by PCR: NEGATIVE
SARS Coronavirus 2 by RT PCR: NEGATIVE

## 2020-08-15 LAB — PROTEIN / CREATININE RATIO, URINE
Creatinine, Urine: 58.22 mg/dL
Protein Creatinine Ratio: 0.12 mg/mg{Cre} (ref 0.00–0.15)
Total Protein, Urine: 7 mg/dL

## 2020-08-15 LAB — TYPE AND SCREEN
ABO/RH(D): B POS
Antibody Screen: NEGATIVE

## 2020-08-15 LAB — CBC
HCT: 39.9 % (ref 36.0–46.0)
Hemoglobin: 13.4 g/dL (ref 12.0–15.0)
MCH: 31.3 pg (ref 26.0–34.0)
MCHC: 33.6 g/dL (ref 30.0–36.0)
MCV: 93.2 fL (ref 80.0–100.0)
Platelets: 167 10*3/uL (ref 150–400)
RBC: 4.28 MIL/uL (ref 3.87–5.11)
RDW: 13.4 % (ref 11.5–15.5)
WBC: 9.9 10*3/uL (ref 4.0–10.5)
nRBC: 0 % (ref 0.0–0.2)

## 2020-08-15 MED ORDER — PHENYLEPHRINE 40 MCG/ML (10ML) SYRINGE FOR IV PUSH (FOR BLOOD PRESSURE SUPPORT)
80.0000 ug | PREFILLED_SYRINGE | INTRAVENOUS | Status: DC | PRN
  Administered 2020-08-15: 17:00:00 80 ug via INTRAVENOUS
  Filled 2020-08-15: qty 10

## 2020-08-15 MED ORDER — OXYCODONE-ACETAMINOPHEN 5-325 MG PO TABS: 2.0000 | ORAL_TABLET | ORAL | Status: DC | PRN

## 2020-08-15 MED ORDER — LABETALOL HCL 5 MG/ML IV SOLN: 80.0000 mg | INTRAVENOUS | Status: DC | PRN

## 2020-08-15 MED ORDER — LIDOCAINE HCL (PF) 1 % IJ SOLN
INTRAMUSCULAR | Status: DC | PRN
  Administered 2020-08-15: 10 mL via EPIDURAL

## 2020-08-15 MED ORDER — FENTANYL CITRATE (PF) 100 MCG/2ML IJ SOLN: 100.0000 ug | INTRAMUSCULAR | Status: DC | PRN

## 2020-08-15 MED ORDER — PENICILLIN G POT IN DEXTROSE 60000 UNIT/ML IV SOLN
3.0000 10*6.[IU] | INTRAVENOUS | Status: DC
  Administered 2020-08-15 – 2020-08-16 (×2): 3 10*6.[IU] via INTRAVENOUS
  Filled 2020-08-15 (×2): qty 50

## 2020-08-15 MED ORDER — LACTATED RINGERS IV SOLN: INTRAVENOUS | Status: DC

## 2020-08-15 MED ORDER — MAGNESIUM SULFATE BOLUS VIA INFUSION
4.0000 g | Freq: Once | INTRAVENOUS | Status: AC
  Administered 2020-08-15: 13:00:00 4 g via INTRAVENOUS
  Filled 2020-08-15: qty 1000

## 2020-08-15 MED ORDER — SODIUM CHLORIDE 0.9 % IV SOLN
5.0000 10*6.[IU] | Freq: Once | INTRAVENOUS | Status: AC
  Administered 2020-08-15: 17:00:00 5 10*6.[IU] via INTRAVENOUS
  Filled 2020-08-15: qty 5

## 2020-08-15 MED ORDER — PHENYLEPHRINE 40 MCG/ML (10ML) SYRINGE FOR IV PUSH (FOR BLOOD PRESSURE SUPPORT)
80.0000 ug | PREFILLED_SYRINGE | INTRAVENOUS | Status: DC | PRN
  Administered 2020-08-15: 17:00:00 80 ug via INTRAVENOUS

## 2020-08-15 MED ORDER — TERBUTALINE SULFATE 1 MG/ML IJ SOLN: 0.2500 mg | Freq: Once | INTRAMUSCULAR | Status: DC | PRN

## 2020-08-15 MED ORDER — LIDOCAINE HCL (PF) 1 % IJ SOLN: 30.0000 mL | INTRAMUSCULAR | Status: DC | PRN

## 2020-08-15 MED ORDER — OXYTOCIN-SODIUM CHLORIDE 30-0.9 UT/500ML-% IV SOLN
1.0000 m[IU]/min | INTRAVENOUS | Status: DC
  Administered 2020-08-15: 21:00:00 2 m[IU]/min via INTRAVENOUS
  Filled 2020-08-15: qty 500

## 2020-08-15 MED ORDER — LACTATED RINGERS IV SOLN: 500.0000 mL | INTRAVENOUS | Status: DC | PRN

## 2020-08-15 MED ORDER — ACETAMINOPHEN 325 MG PO TABS: 650.0000 mg | ORAL_TABLET | ORAL | Status: DC | PRN

## 2020-08-15 MED ORDER — OXYTOCIN BOLUS FROM INFUSION
333.0000 mL | Freq: Once | INTRAVENOUS | Status: AC
  Administered 2020-08-16: 02:00:00 333 mL via INTRAVENOUS

## 2020-08-15 MED ORDER — OXYTOCIN-SODIUM CHLORIDE 30-0.9 UT/500ML-% IV SOLN
2.5000 [IU]/h | INTRAVENOUS | Status: DC
  Administered 2020-08-16: 02:00:00 2.5 [IU]/h via INTRAVENOUS

## 2020-08-15 MED ORDER — HYDRALAZINE HCL 20 MG/ML IJ SOLN: 10.0000 mg | INTRAMUSCULAR | Status: DC | PRN

## 2020-08-15 MED ORDER — EPHEDRINE 5 MG/ML INJ
10.0000 mg | INTRAVENOUS | Status: DC | PRN
  Filled 2020-08-15: qty 10

## 2020-08-15 MED ORDER — SOD CITRATE-CITRIC ACID 500-334 MG/5ML PO SOLN: 30.0000 mL | ORAL | Status: DC | PRN

## 2020-08-15 MED ORDER — ACETAMINOPHEN 500 MG PO TABS: 1000.0000 mg | ORAL_TABLET | Freq: Once | ORAL | Status: DC

## 2020-08-15 MED ORDER — MISOPROSTOL 50MCG HALF TABLET: 50.0000 ug | ORAL_TABLET | Freq: Once | ORAL | Status: AC

## 2020-08-15 MED ORDER — LABETALOL HCL 5 MG/ML IV SOLN
40.0000 mg | INTRAVENOUS | Status: DC | PRN
  Filled 2020-08-15: qty 8

## 2020-08-15 MED ORDER — OXYCODONE-ACETAMINOPHEN 5-325 MG PO TABS: 1.0000 | ORAL_TABLET | ORAL | Status: DC | PRN

## 2020-08-15 MED ORDER — DIPHENHYDRAMINE HCL 50 MG/ML IJ SOLN: 12.5000 mg | INTRAMUSCULAR | Status: DC | PRN

## 2020-08-15 MED ORDER — FENTANYL-BUPIVACAINE-NACL 0.5-0.125-0.9 MG/250ML-% EP SOLN
EPIDURAL | Status: AC
  Filled 2020-08-15: qty 250

## 2020-08-15 MED ORDER — LACTATED RINGERS IV SOLN
500.0000 mL | Freq: Once | INTRAVENOUS | Status: AC
  Administered 2020-08-15: 17:00:00 500 mL via INTRAVENOUS

## 2020-08-15 MED ORDER — EPHEDRINE 5 MG/ML INJ: 10.0000 mg | INTRAVENOUS | Status: DC | PRN

## 2020-08-15 MED ORDER — MAGNESIUM SULFATE 40 GM/1000ML IV SOLN
2.0000 g/h | INTRAVENOUS | Status: DC
  Administered 2020-08-16: 04:00:00 2 g/h via INTRAVENOUS
  Filled 2020-08-15 (×2): qty 1000

## 2020-08-15 MED ORDER — FENTANYL-BUPIVACAINE-NACL 0.5-0.125-0.9 MG/250ML-% EP SOLN: 12.0000 mL/h | EPIDURAL | Status: DC | PRN

## 2020-08-15 MED ORDER — MISOPROSTOL 50MCG HALF TABLET
ORAL_TABLET | ORAL | Status: AC
  Administered 2020-08-15: 16:00:00 50 ug via BUCCAL
  Filled 2020-08-15: qty 1

## 2020-08-15 MED ORDER — LABETALOL HCL 5 MG/ML IV SOLN
20.0000 mg | INTRAVENOUS | Status: DC | PRN
  Administered 2020-08-15: 13:00:00 20 mg via INTRAVENOUS
  Filled 2020-08-15: qty 4

## 2020-08-15 MED ORDER — ONDANSETRON HCL 4 MG/2ML IJ SOLN
4.0000 mg | Freq: Four times a day (QID) | INTRAMUSCULAR | Status: DC | PRN
  Administered 2020-08-15: 4 mg via INTRAVENOUS
  Filled 2020-08-15 (×2): qty 2

## 2020-08-15 NOTE — Consult Note (Signed)
MFM Note  This patient was seen for a biophysical profile due to gestational hypertension.  The patient was hospitalized about 2 weeks ago due to elevated blood pressures during which time she received a complete course of antenatal corticosteroids.  She ruled out for preeclampsia during that admission with normal PIH labs, stabilization of her blood pressures, and negative protein in her urine.  She is not currently treated with any medications for her hypertension.  The patient complained of a headache for the past 2 days that did not resolve with Tylenol.  Her blood pressures in our office today were 148/96 and 154/106.  She reports that her blood pressures at home have been as high as 156/112.  A biophysical profile performed today was 8 out of 8.    There was normal amniotic fluid noted on today's ultrasound exam.  Due to her significantly elevated blood pressures, the patient was sent to the MAU for further evaluation.    At her current gestational age I would have a low threshold for delivery should her blood pressures continue to be elevated or should there be any abnormalities in her [facility] labs.

## 2020-08-15 NOTE — H&P (Signed)
Courtney Torres is a 39 y.o. [phone] female at [redacted]w[redacted]d presenting for IOL for PEC w/ severe range BP. She had a headache for the past two days that was unrelieved with Tylenol, today she is asymptomatic. She was sent to MAU today after her BPP (8/8) appointment with MFM at which she had elevated BP, Dr. Judeth Cornfield recommended admission for IOL if her pressures remained elevated/severe range. OB History    Gravida  3   Para  2   Term      Preterm  2   AB      Living  2     SAB      IAB      Ectopic      Multiple      Live Births  2        Obstetric Comments  Pre-eclampsia with 1st, induced and then CS bc baby was breech       Past Medical History:  Diagnosis Date  . Abnormal Pap smear of cervix   . Anxiety   . Depression   . Pregnancy induced hypertension   . Preterm labor    Past Surgical History:  Procedure Laterality Date  . CERVICAL BIOPSY  W/ LOOP ELECTRODE EXCISION    . CESAREAN SECTION    . COLPOSCOPY    . TONSILLECTOMY AND ADENOIDECTOMY     Family History: family history includes Alcohol abuse in her brother; Anxiety disorder in her mother; Arthritis in her father and mother; Asthma in her father; COPD in her father; Depression in her mother; Drug abuse in her brother; Early death in her sister; Heart disease in her sister; Heart failure in her sister; Hypertension in her brother, father, and paternal grandfather. Social History:  reports that she has quit smoking. Her smoking use included cigarettes. She smoked 0.50 packs per day. She has never used smokeless tobacco. She reports previous alcohol use. She reports that she does not use drugs.    Maternal Diabetes: No Genetic Screening: Normal Maternal Ultrasounds/Referrals: Normal Fetal Ultrasounds or other Referrals:  Referred to Materal Fetal Medicine  Maternal Substance Abuse:  No Significant Maternal Medications:  None Significant Maternal Lab Results:  Group B Strep negative Other Comments:   None  Review of Systems  Constitutional: Negative for fatigue and fever.  HENT: Negative for congestion and sore throat.   Eyes: Negative for visual disturbance.  Respiratory: Negative for cough and shortness of breath.   Gastrointestinal: Negative for abdominal pain and vomiting.  Genitourinary: Negative for dysuria, pelvic pain and vaginal bleeding.  Neurological: Negative for syncope and headaches.   Maternal Medical History:  Fetal activity: Perceived fetal activity is normal.   Last perceived fetal movement was within the past hour.    Prenatal complications: PIH and pre-eclampsia.   Prenatal Complications - Diabetes: none.      Blood pressure (!) 150/111, pulse (!) 106, temperature 98.1 F (36.7 C), resp. rate 18, last menstrual period 12/08/2019, SpO2 98 %. Maternal Exam:  Abdomen: Patient reports no abdominal tenderness. Fetal presentation: vertex  Pelvis: adequate for delivery.   Cervix: not evaluated. Was 1cm at appt two weeks ago    Fetal Exam Fetal Monitor Review: Mode: ultrasound.   Baseline rate: 135.  Variability: moderate (6-25 bpm).   Pattern: accelerations present and no decelerations.    Fetal State Assessment: Category I - tracings are normal.     Physical Exam Vitals and nursing note reviewed.  Constitutional:      General: She is  not in acute distress.    Appearance: Normal appearance. She is normal weight. She is not ill-appearing.  HENT:     Mouth/Throat:     Mouth: Mucous membranes are moist.  Eyes:     Pupils: Pupils are equal, round, and reactive to light.  Cardiovascular:     Rate and Rhythm: Normal rate.     Pulses: Normal pulses.  Pulmonary:     Effort: Pulmonary effort is normal.  Abdominal:     General: Bowel sounds are normal.     Comments: gravid  Musculoskeletal:        General: Normal range of motion.     Right lower leg: No edema.     Left lower leg: No edema.  Skin:    General: Skin is warm and dry.     Capillary  Refill: Capillary refill takes less than 2 seconds.     Coloration: Skin is not jaundiced or pale.     Findings: No rash.  Neurological:     Mental Status: She is alert and oriented to person, place, and time.  Psychiatric:        Mood and Affect: Mood normal.        Behavior: Behavior normal.        Thought Content: Thought content normal.        Judgment: Judgment normal.     Prenatal labs: ABO, Rh: --/--/B POS (12/22 1048) Antibody: NEG (12/22 1048) Rubella: 4.70 (06/25 1135) RPR: Non Reactive (10/29 0928)  HBsAg: Negative (06/25 1135)  HIV: Non Reactive (10/29 0928)  GBS:  Negative  Assessment/Plan: Admit to L&D for IOL Hx of C/S, amenable to FB+Pitocin  Planning epidural - pt has a history of precipitous labor and would like epidural prior to AROM if possible Labetalol protocol and magnesium bolus/infusion ordered  Bernerd Limbo 08/15/2020, 12:17 PM

## 2020-08-15 NOTE — Progress Notes (Signed)
Labor Progress Note Courtney Torres is a 39 y.o. [id number] at [redacted]w[redacted]d presented for IOL for PEC w SF. TOLAC.  S: Feeling shaky but comfortable overall   O:  BP 133/64   Pulse 96   Temp 98.3 F (36.8 C) (Oral)   Resp 16   Ht 5\' 4"  (1.626 m)   Wt 92 kg   LMP 12/08/2019 (Exact Date)   SpO2 100%   BMI 34.81 kg/m  EFM: 135/mod variability/pos accels/no decels   CVE: Dilation: 6 Effacement (%): 70 Station: -2 Presentation: Vertex Exam by:: Dr. [email or web address]   A&P: 39 y.o. 30 [redacted]w[redacted]d here for IOL for PEC w SF.   #TOLAC/Labor: s/p cytotec and FB. AROM at 2045 for clear fluid. Started pitocin at that time. Anticipate SVD.   #PEC w SF: based on severe range BP. PEC labs normal. UPC .12.  Continue Mg. Asymptomatic at this time. BP has been normotensive to MR since epidural placement.  #Pain: Epidural in place #FWB: cat I  #GBS not done,   #Contraception: Desires pp BTL, papers signed. Will notify OR Charge to schedule BTL after delivery.  2046, MD 9:19 PM

## 2020-08-15 NOTE — Progress Notes (Signed)
C/o" headache 12/20 & 12/21-not currently. Sharp pain in cervix-intermittent."

## 2020-08-15 NOTE — Anesthesia Preprocedure Evaluation (Signed)
Anesthesia Evaluation  Patient identified by MRN, date of birth, ID band Patient awake    Reviewed: Allergy & Precautions, NPO status , Patient's Chart, lab work & pertinent test results  Airway Mallampati: II  TM Distance: >3 FB Neck ROM: Full    Dental no notable dental hx.    Pulmonary former smoker,    Pulmonary exam normal breath sounds clear to auscultation       Cardiovascular hypertension (Preeclampsia on Mg), Pt. on medications Normal cardiovascular exam Rhythm:Regular Rate:Normal     Neuro/Psych PSYCHIATRIC DISORDERS Anxiety Depression    GI/Hepatic negative GI ROS, Neg liver ROS,   Endo/Other  negative endocrine ROS  Renal/GU negative Renal ROS  negative genitourinary   Musculoskeletal negative musculoskeletal ROS (+)   Abdominal   Peds  Hematology negative hematology ROS (+)   Anesthesia Other Findings   Reproductive/Obstetrics negative OB ROS                             Anesthesia Physical Anesthesia Plan  ASA: III  Anesthesia Plan: Epidural   Post-op Pain Management:    Induction:   PONV Risk Score and Plan:   Airway Management Planned: Natural Airway  Additional Equipment:   Intra-op Plan:   Post-operative Plan:   Informed Consent: I have reviewed the patients History and Physical, chart, labs and discussed the procedure including the risks, benefits and alternatives for the proposed anesthesia with the patient or authorized representative who has indicated his/her understanding and acceptance.       Plan Discussed with: Anesthesiologist  Anesthesia Plan Comments:         Anesthesia Quick Evaluation

## 2020-08-15 NOTE — MAU Note (Signed)
Sent from MD office for BP evaluation.  Denies current H/A, visual disturbances, and epigastric pain.  Reports H/A yesterday and day before yesterday, unrelieved with Tylenol.  Endorses +FM.

## 2020-08-15 NOTE — Anesthesia Procedure Notes (Signed)
Epidural Patient location during procedure: OB Start time: 08/15/2020 4:55 PM End time: 08/15/2020 5:05 PM  Staffing Anesthesiologist: Mellody Dance, MD Performed: anesthesiologist   Preanesthetic Checklist Completed: patient identified, IV checked, site marked, risks and benefits discussed, monitors and equipment checked, pre-op evaluation and timeout performed  Epidural Patient position: sitting Prep: DuraPrep Patient monitoring: heart rate, cardiac monitor, continuous pulse ox and blood pressure Approach: midline Location: L2-L3 Injection technique: LOR saline  Needle:  Needle type: Tuohy  Needle gauge: 17 G Needle length: 9 cm Needle insertion depth: 8 cm Catheter type: closed end flexible Catheter size: 20 Guage Catheter at skin depth: 13 cm Test dose: negative and Other  Assessment Events: blood not aspirated, injection not painful, no injection resistance and negative IV test  Additional Notes Informed consent obtained prior to proceeding including risk of failure, 1% risk of PDPH, risk of minor discomfort and bruising.  Discussed rare but serious complications including epidural abscess, permanent nerve injury, epidural hematoma.  Discussed alternatives to epidural analgesia and patient desires to proceed.  Timeout performed pre-procedure verifying patient name, procedure, and platelet count.  Patient tolerated procedure well.

## 2020-08-16 ENCOUNTER — Encounter (HOSPITAL_COMMUNITY)
Admission: AD | Disposition: A | Discharge status: Home / Self Care | Payer: Self-pay | Source: Home / Self Care | Attending: Obstetrics and Gynecology

## 2020-08-16 ENCOUNTER — Inpatient Hospital Stay (HOSPITAL_COMMUNITY): Payer: Medicaid Other | Admitting: Anesthesiology

## 2020-08-16 ENCOUNTER — Encounter (HOSPITAL_COMMUNITY): Payer: Self-pay | Admitting: Family Medicine

## 2020-08-16 DIAGNOSIS — Z302 Encounter for sterilization: Secondary | ICD-10-CM

## 2020-08-16 DIAGNOSIS — O34211 Maternal care for low transverse scar from previous cesarean delivery: Secondary | ICD-10-CM

## 2020-08-16 DIAGNOSIS — O42013 Preterm premature rupture of membranes, onset of labor within 24 hours of rupture, third trimester: Secondary | ICD-10-CM

## 2020-08-16 DIAGNOSIS — Z3A35 35 weeks gestation of pregnancy: Secondary | ICD-10-CM

## 2020-08-16 DIAGNOSIS — O1414 Severe pre-eclampsia complicating childbirth: Secondary | ICD-10-CM

## 2020-08-16 DIAGNOSIS — O99824 Streptococcus B carrier state complicating childbirth: Secondary | ICD-10-CM

## 2020-08-16 HISTORY — PX: TUBAL LIGATION: SHX77

## 2020-08-16 LAB — CBC
HCT: 30.2 % — ABNORMAL LOW (ref 36.0–46.0)
Hemoglobin: 10.2 g/dL — ABNORMAL LOW (ref 12.0–15.0)
MCH: 31.9 pg (ref 26.0–34.0)
MCHC: 33.8 g/dL (ref 30.0–36.0)
MCV: 94.4 fL (ref 80.0–100.0)
Platelets: 135 10*3/uL — ABNORMAL LOW (ref 150–400)
RBC: 3.2 MIL/uL — ABNORMAL LOW (ref 3.87–5.11)
RDW: 13.8 % (ref 11.5–15.5)
WBC: 15.6 10*3/uL — ABNORMAL HIGH (ref 4.0–10.5)
nRBC: 0 % (ref 0.0–0.2)

## 2020-08-16 LAB — RPR: RPR Ser Ql: NONREACTIVE

## 2020-08-16 SURGERY — LIGATION, FALLOPIAN TUBE, POSTPARTUM
Anesthesia: Choice

## 2020-08-16 SURGERY — LIGATION, FALLOPIAN TUBE, POSTPARTUM
Anesthesia: Choice | Laterality: Bilateral

## 2020-08-16 MED ORDER — ONDANSETRON HCL 4 MG/2ML IJ SOLN
4.0000 mg | INTRAMUSCULAR | Status: DC | PRN
  Administered 2020-08-16: 05:00:00 4 mg via INTRAVENOUS

## 2020-08-16 MED ORDER — OXYCODONE HCL 5 MG PO TABS: 5.0000 mg | ORAL_TABLET | ORAL | Status: DC | PRN

## 2020-08-16 MED ORDER — DIBUCAINE (PERIANAL) 1 % EX OINT: 1.0000 "application " | TOPICAL_OINTMENT | CUTANEOUS | Status: DC | PRN

## 2020-08-16 MED ORDER — PROPOFOL 10 MG/ML IV BOLUS
INTRAVENOUS | Status: AC
  Filled 2020-08-16: qty 20

## 2020-08-16 MED ORDER — MIDAZOLAM HCL 2 MG/2ML IJ SOLN
INTRAMUSCULAR | Status: AC
  Filled 2020-08-16: qty 2

## 2020-08-16 MED ORDER — ONDANSETRON HCL 4 MG/2ML IJ SOLN
INTRAMUSCULAR | Status: DC | PRN
  Administered 2020-08-16: 4 mg via INTRAVENOUS

## 2020-08-16 MED ORDER — BUPIVACAINE HCL (PF) 0.25 % IJ SOLN
INTRAMUSCULAR | Status: DC | PRN
  Administered 2020-08-16: 30 mL

## 2020-08-16 MED ORDER — FENTANYL CITRATE (PF) 100 MCG/2ML IJ SOLN
INTRAMUSCULAR | Status: AC
  Filled 2020-08-16: qty 2

## 2020-08-16 MED ORDER — IBUPROFEN 800 MG PO TABS
800.0000 mg | ORAL_TABLET | Freq: Three times a day (TID) | ORAL | Status: DC
  Administered 2020-08-16 – 2020-08-18 (×6): 800 mg via ORAL
  Filled 2020-08-16 (×6): qty 1

## 2020-08-16 MED ORDER — LACTATED RINGERS IV SOLN: INTRAVENOUS | Status: DC | PRN

## 2020-08-16 MED ORDER — MAGNESIUM SULFATE 40 GM/1000ML IV SOLN
2.0000 g/h | INTRAVENOUS | Status: AC
  Administered 2020-08-17: 2 g/h via INTRAVENOUS
  Filled 2020-08-16: qty 1000

## 2020-08-16 MED ORDER — PHENYLEPHRINE HCL (PRESSORS) 10 MG/ML IV SOLN
INTRAVENOUS | Status: DC | PRN
  Administered 2020-08-16 (×2): 100 ug via INTRAVENOUS

## 2020-08-16 MED ORDER — KETOROLAC TROMETHAMINE 60 MG/2ML IM SOLN
INTRAMUSCULAR | Status: DC | PRN
  Administered 2020-08-16: 30 mg via INTRAMUSCULAR

## 2020-08-16 MED ORDER — PROPOFOL 500 MG/50ML IV EMUL
INTRAVENOUS | Status: DC | PRN
  Administered 2020-08-16 (×2): 20 mg via INTRAVENOUS

## 2020-08-16 MED ORDER — SIMETHICONE 80 MG PO CHEW
80.0000 mg | CHEWABLE_TABLET | ORAL | Status: DC | PRN
  Administered 2020-08-16: 22:00:00 80 mg via ORAL
  Filled 2020-08-16: qty 1

## 2020-08-16 MED ORDER — DOCUSATE SODIUM 100 MG PO CAPS
100.0000 mg | ORAL_CAPSULE | Freq: Two times a day (BID) | ORAL | Status: DC
  Administered 2020-08-16 – 2020-08-18 (×4): 100 mg via ORAL
  Filled 2020-08-16 (×4): qty 1

## 2020-08-16 MED ORDER — BUPIVACAINE HCL (PF) 0.5 % IJ SOLN
INTRAMUSCULAR | Status: AC
  Filled 2020-08-16: qty 30

## 2020-08-16 MED ORDER — IBUPROFEN 600 MG PO TABS
600.0000 mg | ORAL_TABLET | Freq: Four times a day (QID) | ORAL | Status: DC
  Administered 2020-08-16: 06:00:00 600 mg via ORAL
  Filled 2020-08-16 (×2): qty 1

## 2020-08-16 MED ORDER — SENNOSIDES-DOCUSATE SODIUM 8.6-50 MG PO TABS
2.0000 | ORAL_TABLET | ORAL | Status: DC
  Administered 2020-08-16 – 2020-08-17 (×2): 2 via ORAL
  Filled 2020-08-16 (×2): qty 2

## 2020-08-16 MED ORDER — KETOROLAC TROMETHAMINE 30 MG/ML IJ SOLN
INTRAMUSCULAR | Status: AC
  Filled 2020-08-16: qty 1

## 2020-08-16 MED ORDER — PRENATAL MULTIVITAMIN CH
1.0000 | ORAL_TABLET | Freq: Every day | ORAL | Status: DC
  Administered 2020-08-16 – 2020-08-17 (×2): 1 via ORAL
  Filled 2020-08-16 (×2): qty 1

## 2020-08-16 MED ORDER — OXYCODONE HCL 5 MG PO TABS
5.0000 mg | ORAL_TABLET | Freq: Four times a day (QID) | ORAL | Status: DC | PRN
  Administered 2020-08-17: 5 mg via ORAL
  Filled 2020-08-16: qty 1

## 2020-08-16 MED ORDER — ONDANSETRON HCL 4 MG PO TABS: 4.0000 mg | ORAL_TABLET | ORAL | Status: DC | PRN

## 2020-08-16 MED ORDER — LIDOCAINE-EPINEPHRINE (PF) 2 %-1:200000 IJ SOLN
INTRAMUSCULAR | Status: DC | PRN
  Administered 2020-08-16 (×2): 5 mL via INTRADERMAL
  Administered 2020-08-16: 10 mL via INTRADERMAL

## 2020-08-16 MED ORDER — PHENYLEPHRINE 40 MCG/ML (10ML) SYRINGE FOR IV PUSH (FOR BLOOD PRESSURE SUPPORT)
PREFILLED_SYRINGE | INTRAVENOUS | Status: AC
  Filled 2020-08-16: qty 10

## 2020-08-16 MED ORDER — MIDAZOLAM HCL 5 MG/5ML IJ SOLN
INTRAMUSCULAR | Status: DC | PRN
  Administered 2020-08-16 (×2): 2 mg via INTRAVENOUS

## 2020-08-16 MED ORDER — ACETAMINOPHEN 325 MG PO TABS
650.0000 mg | ORAL_TABLET | ORAL | Status: DC | PRN
  Administered 2020-08-16: 650 mg via ORAL
  Filled 2020-08-16: qty 2

## 2020-08-16 MED ORDER — WITCH HAZEL-GLYCERIN EX PADS: 1.0000 "application " | MEDICATED_PAD | CUTANEOUS | Status: DC | PRN

## 2020-08-16 MED ORDER — COCONUT OIL OIL: 1.0000 "application " | TOPICAL_OIL | Status: DC | PRN

## 2020-08-16 MED ORDER — AMLODIPINE BESYLATE 5 MG PO TABS
5.0000 mg | ORAL_TABLET | Freq: Every day | ORAL | Status: DC
  Administered 2020-08-16 – 2020-08-18 (×3): 5 mg via ORAL
  Filled 2020-08-16 (×3): qty 1

## 2020-08-16 MED ORDER — BENZOCAINE-MENTHOL 20-0.5 % EX AERO: 1.0000 "application " | INHALATION_SPRAY | CUTANEOUS | Status: DC | PRN

## 2020-08-16 MED ORDER — ONDANSETRON HCL 4 MG/2ML IJ SOLN
INTRAMUSCULAR | Status: AC
  Filled 2020-08-16: qty 2

## 2020-08-16 MED ORDER — FENTANYL CITRATE (PF) 100 MCG/2ML IJ SOLN
INTRAMUSCULAR | Status: DC | PRN
  Administered 2020-08-16: 100 ug via INTRAVENOUS

## 2020-08-16 MED ORDER — MORPHINE SULFATE (PF) 2 MG/ML IV SOLN: 2.0000 mg | INTRAVENOUS | Status: DC | PRN

## 2020-08-16 MED ORDER — DIPHENHYDRAMINE HCL 25 MG PO CAPS: 25.0000 mg | ORAL_CAPSULE | Freq: Four times a day (QID) | ORAL | Status: DC | PRN

## 2020-08-16 MED ORDER — LACTATED RINGERS IV SOLN
INTRAVENOUS | Status: DC
  Administered 2020-08-16: 23:00:00 75 mL/h via INTRAVENOUS

## 2020-08-16 MED ORDER — TETANUS-DIPHTH-ACELL PERTUSSIS 5-2.5-18.5 LF-MCG/0.5 IM SUSY: 0.5000 mL | PREFILLED_SYRINGE | Freq: Once | INTRAMUSCULAR | Status: DC

## 2020-08-16 SURGICAL SUPPLY — 21 items
CLOTH BEACON ORANGE TIMEOUT ST (SAFETY) ×3 IMPLANT
DRSG OPSITE POSTOP 3X4 (GAUZE/BANDAGES/DRESSINGS) ×3 IMPLANT
DURAPREP 26ML APPLICATOR (WOUND CARE) ×3 IMPLANT
GLOVE BIO SURGEON STRL SZ7.5 (GLOVE) ×3 IMPLANT
GLOVE BIOGEL PI IND STRL 7.0 (GLOVE) ×2 IMPLANT
GLOVE BIOGEL PI INDICATOR 7.0 (GLOVE) ×4
GOWN STRL REUS W/TWL LRG LVL3 (GOWN DISPOSABLE) ×3 IMPLANT
GOWN STRL REUS W/TWL XL LVL3 (GOWN DISPOSABLE) ×3 IMPLANT
NEEDLE HYPO 22GX1.5 SAFETY (NEEDLE) ×3 IMPLANT
NS IRRIG 1000ML POUR BTL (IV SOLUTION) ×3 IMPLANT
PACK ABDOMINAL MINOR (CUSTOM PROCEDURE TRAY) ×3 IMPLANT
PROTECTOR NERVE ULNAR (MISCELLANEOUS) ×3 IMPLANT
SPONGE LAP 4X18 RFD (DISPOSABLE) IMPLANT
SUT MNCRL AB 4-0 PS2 18 (SUTURE) ×3 IMPLANT
SUT PLAIN 0 NONE (SUTURE) ×3 IMPLANT
SUT VIC AB 0 CT1 27 (SUTURE) ×2
SUT VIC AB 0 CT1 27XBRD ANBCTR (SUTURE) ×1 IMPLANT
SYR CONTROL 10ML LL (SYRINGE) ×3 IMPLANT
TOWEL OR 17X24 6PK STRL BLUE (TOWEL DISPOSABLE) ×6 IMPLANT
TRAY FOLEY CATH SILVER 14FR (SET/KITS/TRAYS/PACK) ×3 IMPLANT
WATER STERILE IRR 1000ML POUR (IV SOLUTION) ×3 IMPLANT

## 2020-08-16 NOTE — Progress Notes (Signed)
Attending Circumcision Counseling Progress Note  Patient desires circumcision for her female infant.  Circumcision procedure details discussed, risks and benefits of procedure were also discussed.  These include but are not limited to: Benefits of circumcision in men include reduction in the rates of urinary tract infection (UTI), penile cancer, some sexually transmitted infections, penile inflammatory and retractile disorders, as well as easier hygiene.  Risks include bleeding , infection, injury of glans which may lead to penile deformity or urinary tract issues, unsatisfactory cosmetic appearance and other potential complications related to the procedure.  It was emphasized that this is an elective procedure.  Patient wants to proceed with circumcision; written informed consent obtained.  Will do circumcision soon, routine circumcision and post circumcision care ordered for the infant.  Rasha Ibe L. Alysia Penna, M.D. 08/16/2020 3:49 PM

## 2020-08-16 NOTE — Op Note (Signed)
    Carlyn Reichert PROCEDURE DATE: 08/16/2020   PREOPERATIVE DIAGNOSIS:  Undesired fertility  POSTOPERATIVE DIAGNOSIS:  Undesired fertility  PROCEDURE:  Postpartum Bilateral Salpingectomy   SURGEON: Fernando Torry L. Shannia Jacuinde  ASSISTANT:   ANESTHESIA:  Epidural and 30 cc local  COMPLICATIONS:  None immediate.  ESTIMATED BLOOD LOSS:  5 ml.  FLUIDS: As recorded.  URINE OUTPUT:  As recorded  IINDICATIONS: 39 y.o. G3P0303 with undesired fertility, desires permanent sterilization. Other reversible forms of contraception were discussed with patient; she declines all other modalities.  Risks of procedure discussed with patient including permanence of method, risk of regret, bleeding, infection, injury to surrounding organs and need for additional procedures including laparotomy.  Failure risk less than 0.5% with increased risk of ectopic gestation if pregnancy occurs was also discussed with patient.  Written informed consent was obtained.    FINDINGS:  Normal uterus, fallopian tubes, and ovaries.  TECHNIQUE:  The patient was taken to the operating room where general anesthesia was obtained without difficulty.  She was then placed in the dorsal lithotomy position and prepared and draped in sterile fashion.  An adequate timeout was performed.  Attention was then turned to the patient's abdomen where a small preiumbiclal incision was made and carried down to the fascia. The fascia was grasped and cut and entry was gained into the abdominal cavity. 11-mm skin incision was made in the umbilical fold.   A survey of the patient's pelvis and abdomen revealed. Each fallopian tube was grasped with a babcock clamp and carried out to its fimbriated end. The mesosalpinx was clamped and tied with plain cut ties x 2. The tube was removed. The process was repeated on the opposite side in the same fashion. Hemostasis was noted.  The fascial incision of the umbilicus was closed with a 0 Vicryl figure of eight stitch.  The  skin incision was closed with 4/0 Monocryl. The patient tolerated the procedure well.  Sponge, lap, and needle counts were correct times two.  The patient was then taken to the recovery room awake, extubated and in stable condition.   Hermina Staggers, MD, FACOG Attending Obstetrician & Gynecologist Faculty Practice, [facility]

## 2020-08-16 NOTE — Progress Notes (Signed)
Post Partum Day 0 VBAC, IOL d/t SPEC Subjective: Pt without complaints this morning. Denies HA or visual changes. Pain controlled. NPO for tubal  Objective: Blood pressure 130/80, pulse 96, temperature 98.1 F (36.7 C), temperature source Oral, resp. rate 18, height 5\' 4"  (1.626 m), weight 92 kg, last menstrual period 12/08/2019, SpO2 98 %, unknown if currently breastfeeding.  Physical Exam:  General: alert Lochia: appropriate Uterine Fundus: firm Incision: healing well DVT Evaluation: No evidence of DVT seen on physical exam.  Recent Labs    08/15/20 1048  HGB 13.4  HCT 39.9    Assessment/Plan: Stable. Magnesium x 24 hrs. Norvasc 5 qd, starting today. Continue with current postpartum management.  Patient desires bilateral tubal sterilization.  Other reversible forms of contraception were discussed with patient; she declines all other modalities. Discussed bilateral tubal sterilization in detail; discussed options of laparoscopic bilateral tubal sterilization using Filshie clips vs laparoscopic bilateral salpingectomy. Risks and benefits discussed in detail including but not limited to: risk of regret, permanence of method, bleeding, infection, injury to surrounding organs and need for additional procedures.  Failure risk of 1-2 % for Filshie clips and <1% for bilateral salpingectomy with increased risk of ectopic gestation if pregnancy occurs was also discussed with patient.  Also discussed possible reduction of risk of ovarian cancer via bilateral salpingectomy given that a growing body of knowledge reveals that the majority of cases of high grade serous "ovarian" cancer actually are actually  cancers arising from the fimbriated end of the fallopian tubes. Emphasized that removal of fallopian tubes do not result in any known hormonal imbalance.  Patient verbalized understanding of these risks and benefits and wants to proceed with sterilization with bilateral salpingectomy.        LOS: 1 day   08/17/20 08/16/2020, 9:22 AM

## 2020-08-16 NOTE — Anesthesia Postprocedure Evaluation (Signed)
Anesthesia Post Note  Patient: [name]  Procedure(s) Performed: POST PARTUM TUBAL LIGATION (Bilateral )     Patient location during evaluation: OB High Risk Anesthesia Type: Epidural Level of consciousness: awake and alert Pain management: pain level controlled Vital Signs Assessment: post-procedure vital signs reviewed and stable Respiratory status: spontaneous breathing Cardiovascular status: stable Postop Assessment: no headache, adequate PO intake, no backache, patient able to bend at knees, able to ambulate, epidural receding and no apparent nausea or vomiting Anesthetic complications: no   No complications documented.  Last Vitals:  Vitals:   08/16/20 0535 08/16/20 0815  BP: 128/84 130/80  Pulse: 97 96  Resp: 18 18  Temp:  36.7 C  SpO2: 100% 98%    Last Pain:  Vitals:   08/16/20 0815  TempSrc: Oral  PainSc: 0-No pain   Pain Goal:                Epidural/Spinal Function Cutaneous sensation: Normal sensation (08/16/20 0815), Patient able to flex knees: Yes (08/16/20 0815), Patient able to lift hips off bed: No (08/16/20 0815), Back pain beyond tenderness at insertion site: No (08/16/20 0815), Progressively worsening motor and/or sensory loss: No (08/16/20 0815), Bowel and/or bladder incontinence post epidural: No (08/16/20 0815)  Courtney Torres

## 2020-08-16 NOTE — Discharge Summary (Signed)
Postpartum Discharge Summary  Date of Service updated 08/18/20     Patient Name: Courtney Torres DOB: 1981-02-06 MRN: [id number]  Date of admission: 08/15/2020 Delivery date:08/16/2020  Delivering provider: Janet Berlin  Date of discharge: 08/18/2020  Admitting diagnosis: Gestational hypertension without significant proteinuria [O13.9] Intrauterine pregnancy: [redacted]w[redacted]d    Secondary diagnosis:  Active Problems:   Gestational hypertension without significant proteinuria  Additional problems: na    Discharge diagnosis: Preterm Pregnancy Delivered, VBAC and Preeclampsia (severe)                                              Post partum procedures:postpartum tubal ligation Augmentation: AROM, Pitocin and IP Foley Complications: None  Hospital course: Induction of Labor With Vaginal Delivery   39y.o. yo G3P0202 at 363w0das admitted to the hospital 08/15/2020 for induction of labor.  Indication for induction: Preeclampsia.  Patient had an uncomplicated labor course as follows: Membrane Rupture Time/Date: 8:40 PM ,08/15/2020   Delivery Method:VBAC, Spontaneous  Episiotomy: None  Lacerations:  Labial  Details of delivery can be found in separate delivery note.  Patient received magnesium x 24 hrs. Started on Norvasc with good response. Bilateral salpingectomy on 08/16/20, without problems. See OP note for additional information. Progressed to ambulating, voiding, tolerating diet and good oral pain control. Felt amendable for discharge home. Discharge instructions, medications and follow up reviewed wed with pt. had a routine postpartum course. Patient is discharged home 08/18/20.  Newborn Data: Birth date:08/16/2020  Birth time:1:34 AM  Gender:Female  Living status:Living  Apgars:3 ,9  Weight:3184 g   Magnesium Sulfate received: Yes BMZ received: Yes Rhophylac:N/A MMR:N/A T-DaP:Given prenatally Flu: Yes Transfusion:No  Physical exam  Vitals:   08/17/20 1959 08/17/20 2324  08/18/20 0507 08/18/20 0734  BP: 132/81 115/62 120/76 129/80  Pulse: 66 83 67 70  Resp: 18 18 18 18   Temp: 98.1 F (36.7 C) 98 F (36.7 C) 98.1 F (36.7 C) 97.7 F (36.5 C)  TempSrc: Oral Oral Oral Oral  SpO2: 99% 100% 99% 98%  Weight:      Height:       General: alert Lochia: appropriate Uterine Fundus: firm Incision: Healing well with no significant drainage DVT Evaluation: No evidence of DVT seen on physical exam. Labs: Lab Results  Component Value Date   WBC 14.0 (H) 08/17/2020   HGB 9.7 (L) 08/17/2020   HCT 29.2 (L) 08/17/2020   MCV 93.9 08/17/2020   PLT 154 08/17/2020   CMP Latest Ref Rng & Units 08/17/2020  Glucose 70 - 99 mg/dL 105(H)  BUN 6 - 20 mg/dL 8  Creatinine 0.44 - 1.00 mg/dL 0.66  Sodium 135 - 145 mmol/L 134(L)  Potassium 3.5 - 5.1 mmol/L 3.8  Chloride 98 - 111 mmol/L 103  CO2 22 - 32 mmol/L 23  Calcium 8.9 - 10.3 mg/dL 7.1(L)  Total Protein 6.5 - 8.1 g/dL 5.3(L)  Total Bilirubin 0.3 - 1.2 mg/dL 0.4  Alkaline Phos 38 - 126 U/L 105  AST 15 - 41 U/L 25  ALT 0 - 44 U/L 13   Edinburgh Score: Edinburgh Postnatal Depression Scale Screening Tool 08/18/2020  I have been able to laugh and see the funny side of things. (No Data)  I have looked forward with enjoyment to things. -  I have blamed myself unnecessarily when things went wrong. -  I have been anxious or worried for no good reason. -  I have felt scared or panicky for no good reason. -  Things have been getting on top of me. -  I have been so unhappy that I have had difficulty sleeping. -  I have felt sad or miserable. -  I have been so unhappy that I have been crying. -  The thought of harming myself has occurred to me. Flavia Shipper Postnatal Depression Scale Total -     After visit meds:  Allergies as of 08/18/2020   No Known Allergies     Medication List    STOP taking these medications   acetaminophen 500 MG tablet Commonly known as: TYLENOL   aspirin EC 81 MG tablet    cimetidine 200 MG tablet Commonly known as: TAGAMET   doxylamine (Sleep) 25 MG tablet Commonly known as: UNISOM     TAKE these medications   amLODipine 5 MG tablet Commonly known as: NORVASC Take 1 tablet (5 mg total) by mouth daily. Start taking on: August 19, 2020   ibuprofen 800 MG tablet Commonly known as: ADVIL Take 1 tablet (800 mg total) by mouth 3 (three) times daily.   oxyCODONE 5 MG immediate release tablet Commonly known as: Oxy IR/ROXICODONE Take 1 tablet (5 mg total) by mouth every 6 (six) hours as needed for moderate pain.   prenatal multivitamin Tabs tablet Take 1 tablet by mouth daily at 12 noon.            Discharge Care Instructions  (From admission, onward)         Start     Ordered   08/18/20 0000  Discharge wound care:       Comments: Remove honeycomb dressing on 08/21/20   08/18/20 1020           Discharge home in stable condition Infant Feeding: Bottle Infant Disposition:home with mother Discharge instruction: per After Visit Summary and Postpartum booklet. Activity: Advance as tolerated. Pelvic rest for 6 weeks.  Diet: routine diet Future Appointments: Future Appointments  Date Time Provider [place]  08/21/2020  9:30 AM [facility] None  08/21/2020 10:00 AM [facility] NURSE [facility]  08/21/2020 10:15 AM [facility] US2 [facility]  08/23/2020 11:00 AM [facility] NURSE [facility] None  09/20/2020  2:00 PM Constant, Peggy, MD [place] None   Follow up Visit:  [place]. Schedule an appointment as soon as possible for a visit in 1 week(s).   Specialty: Obstetrics and Gynecology Why: 1 week for BP check 4 weeks for PP visit Contact information: [address], [place] [phone]               Please schedule this patient for a In person postpartum visit in 6 weeks with the following provider: MD. Additional  Postpartum F/U:BP check 1 week  High risk pregnancy complicated by: PEC w SF Delivery mode:  VBAC, Spontaneous  Anticipated Birth Control:  BTL done [facility]   08/18/2020 Chancy Milroy, MD

## 2020-08-16 NOTE — Transfer of Care (Signed)
Immediate Anesthesia Transfer of Care Note  Patient: Courtney Torres  Procedure(s) Performed: POST PARTUM TUBAL LIGATION (N/A )  Patient Location: PACU  Anesthesia Type:Epidural  Level of Consciousness: awake, alert , oriented and patient cooperative  Airway & Oxygen Therapy: Patient Spontanous Breathing  Post-op Assessment: Report given to RN and Post -op Vital signs reviewed and stable  Post vital signs: Reviewed and stable  Last Vitals:  Vitals Value Taken Time  BP 75/41 08/16/20 1200  Temp    Pulse 80 08/16/20 1202  Resp 13 08/16/20 1202  SpO2 89 % 08/16/20 1202  Vitals shown include unvalidated device data.  Last Pain:  Vitals:   08/16/20 0815  TempSrc: Oral  PainSc: 0-No pain         Complications: No complications documented.

## 2020-08-17 ENCOUNTER — Inpatient Hospital Stay (HOSPITAL_COMMUNITY): Payer: Medicaid Other

## 2020-08-17 ENCOUNTER — Encounter (HOSPITAL_COMMUNITY): Payer: Self-pay | Admitting: Obstetrics and Gynecology

## 2020-08-17 DIAGNOSIS — Z9851 Tubal ligation status: Secondary | ICD-10-CM

## 2020-08-17 DIAGNOSIS — Z9889 Other specified postprocedural states: Secondary | ICD-10-CM

## 2020-08-17 LAB — CBC
HCT: 29.2 % — ABNORMAL LOW (ref 36.0–46.0)
Hemoglobin: 9.7 g/dL — ABNORMAL LOW (ref 12.0–15.0)
MCH: 31.2 pg (ref 26.0–34.0)
MCHC: 33.2 g/dL (ref 30.0–36.0)
MCV: 93.9 fL (ref 80.0–100.0)
Platelets: 154 10*3/uL (ref 150–400)
RBC: 3.11 MIL/uL — ABNORMAL LOW (ref 3.87–5.11)
RDW: 14 % (ref 11.5–15.5)
WBC: 14 10*3/uL — ABNORMAL HIGH (ref 4.0–10.5)
nRBC: 0 % (ref 0.0–0.2)

## 2020-08-17 LAB — COMPREHENSIVE METABOLIC PANEL
ALT: 13 U/L (ref 0–44)
AST: 25 U/L (ref 15–41)
Albumin: 2.5 g/dL — ABNORMAL LOW (ref 3.5–5.0)
Alkaline Phosphatase: 105 U/L (ref 38–126)
Anion gap: 8 (ref 5–15)
BUN: 8 mg/dL (ref 6–20)
CO2: 23 mmol/L (ref 22–32)
Calcium: 7.1 mg/dL — ABNORMAL LOW (ref 8.9–10.3)
Chloride: 103 mmol/L (ref 98–111)
Creatinine, Ser: 0.66 mg/dL (ref 0.44–1.00)
GFR, Estimated: >60 mL/min (ref >60)
Glucose, Bld: 105 mg/dL — ABNORMAL HIGH (ref 70–99)
Potassium: 3.8 mmol/L (ref 3.5–5.1)
Sodium: 134 mmol/L — ABNORMAL LOW (ref 135–145)
Total Bilirubin: 0.4 mg/dL (ref 0.3–1.2)
Total Protein: 5.3 g/dL — ABNORMAL LOW (ref 6.5–8.1)

## 2020-08-17 MED ORDER — SIMETHICONE 80 MG PO CHEW
80.0000 mg | CHEWABLE_TABLET | Freq: Four times a day (QID) | ORAL | Status: DC
  Administered 2020-08-17 – 2020-08-18 (×5): 80 mg via ORAL
  Filled 2020-08-17 (×5): qty 1

## 2020-08-17 MED ORDER — BISACODYL 10 MG RE SUPP
10.0000 mg | Freq: Once | RECTAL | Status: AC
  Administered 2020-08-17: 17:00:00 10 mg via RECTAL
  Filled 2020-08-17: qty 1

## 2020-08-17 MED ORDER — IOHEXOL 300 MG/ML  SOLN
100.0000 mL | Freq: Once | INTRAMUSCULAR | Status: AC | PRN
  Administered 2020-08-17: 100 mL via INTRAVENOUS

## 2020-08-17 NOTE — Anesthesia Preprocedure Evaluation (Signed)
Anesthesia Evaluation  Patient identified by MRN, date of birth, ID band Patient awake    Reviewed: Allergy & Precautions, NPO status , Patient's Chart, lab work & pertinent test results  Airway Mallampati: II  TM Distance: >3 FB Neck ROM: Full    Dental no notable dental hx. (+) Teeth Intact   Pulmonary former smoker,    Pulmonary exam normal breath sounds clear to auscultation       Cardiovascular hypertension, Pt. on medications  Rhythm:Regular Rate:Normal     Neuro/Psych PSYCHIATRIC DISORDERS Anxiety Depression    GI/Hepatic negative GI ROS, Neg liver ROS,   Endo/Other  negative endocrine ROS  Renal/GU negative Renal ROS  negative genitourinary   Musculoskeletal negative musculoskeletal ROS (+)   Abdominal   Peds  Hematology negative hematology ROS (+)   Anesthesia Other Findings   Reproductive/Obstetrics                             Anesthesia Physical  Anesthesia Plan  ASA: II  Anesthesia Plan: Epidural   Post-op Pain Management:    Induction:   PONV Risk Score and Plan: 2 and Ondansetron, Dexamethasone and Treatment may vary due to age or medical condition  Airway Management Planned: Natural Airway, Simple Face Mask and Nasal Cannula  Additional Equipment: None  Intra-op Plan:   Post-operative Plan:   Informed Consent: I have reviewed the patients History and Physical, chart, labs and discussed the procedure including the risks, benefits and alternatives for the proposed anesthesia with the patient or authorized representative who has indicated his/her understanding and acceptance.     Dental advisory given  Plan Discussed with: CRNA  Anesthesia Plan Comments:         Anesthesia Quick Evaluation

## 2020-08-17 NOTE — Progress Notes (Signed)
Patient c/o severe RUQ pain that is sharp.  VSS.  Patient is passing some flatus.  IV infusing of LR at 75cc/h and Magnesium at 2gm/h.  Oxycodone 5mg  po given at 0204 with very little relief.  Dr 0205 notified, stated will put orders in.

## 2020-08-17 NOTE — Anesthesia Postprocedure Evaluation (Signed)
Anesthesia Post Note  Patient: Meredyth Hornung  Procedure(s) Performed: POST PARTUM TUBAL LIGATION (N/A )     Patient location during evaluation: Mother Baby Anesthesia Type: Epidural Level of consciousness: awake and alert Pain management: pain level controlled Vital Signs Assessment: post-procedure vital signs reviewed and stable Respiratory status: spontaneous breathing, nonlabored ventilation, respiratory function stable and patient connected to nasal cannula oxygen Cardiovascular status: blood pressure returned to baseline and stable Postop Assessment: no apparent nausea or vomiting Anesthetic complications: no   No complications documented.  Last Vitals:  Vitals:   08/17/20 0754 08/17/20 1118  BP: 134/85 130/81  Pulse: 82 87  Resp: 19 18  Temp: 36.8 C 36.7 C  SpO2: 100% 100%    Last Pain:  Vitals:   08/17/20 1118  TempSrc: Oral  PainSc:                  Nelle Don Joni Colegrove

## 2020-08-17 NOTE — Progress Notes (Addendum)
POSTPARTUM PROGRESS NOTE  Post Partum Day 1  Subjective:  Courtney Torres is a 39 y.o. [id number] s/p vaginal at [redacted]w[redacted]d.  She reports she is doing well. Torres acute events overnight. She denies any problems with ambulating, voiding or po intake. Denies nausea or vomiting.  Pain is well controlled.  Lochia is decreasing.  She notes some flatus, but it is slow.  Objective: Blood pressure 130/81, pulse 87, temperature 98.1 F (36.7 C), temperature source Oral, resp. rate 18, height 5\' 4"  (1.626 m), weight 92 kg, last menstrual period 12/08/2019, SpO2 100 %, unknown if currently breastfeeding.  Physical Exam:  General: alert, cooperative and Torres distress Chest: Torres respiratory distress Heart:regular rate, distal pulses intact Abdomen: soft, nontender,  Uterine Fundus: firm, appropriately tender DVT Evaluation: Torres calf swelling or tenderness Extremities: minimal edema Skin: warm, dry  Recent Labs    08/16/20 1209 08/17/20 0415  HGB 10.2* 9.7*  HCT 30.2* 29.2*    Assessment/Plan: Brooksie Ellwanger is a 39 y.o. 30 s/p vaginal delivery at [redacted]w[redacted]d   PPD#1 - Doing well  Routine postpartum care Contraception: tubal ligation   LOS: 2 days   [redacted]w[redacted]d, MD Faculty attending 08/17/2020, 12:05 PM

## 2020-08-17 NOTE — Progress Notes (Signed)
MOB was referred for history of depression/anxiety. * Referral screened out by Clinical Social Worker because none of the following criteria appear to apply: ~ History of anxiety/depression during this pregnancy, or of post-partum depression. ~ Diagnosis of anxiety and/or depression within last 3 years OR * MOB's symptoms currently being treated with medication and/or therapy. Please contact the Clinical Social Worker if needs arise, or if MOB requests.  Pegge Cumberledge Boyd-Gilyard, MSW, LCSW Clinical Social Work [phone] 

## 2020-08-18 MED ORDER — IBUPROFEN 800 MG PO TABS: 800.0000 mg | ORAL_TABLET | Freq: Three times a day (TID) | ORAL | 1 refills | Status: DC

## 2020-08-18 MED ORDER — OXYCODONE HCL 5 MG PO TABS: 5.0000 mg | ORAL_TABLET | Freq: Four times a day (QID) | ORAL | 0 refills | Status: DC | PRN

## 2020-08-18 MED ORDER — AMLODIPINE BESYLATE 5 MG PO TABS: 5.0000 mg | ORAL_TABLET | Freq: Every day | ORAL | 1 refills | Status: DC

## 2020-08-18 NOTE — Plan of Care (Signed)
Problem: Education: Goal: Knowledge of General Education information will improve Description: Including pain rating scale, medication(s)/side effects and non-pharmacologic comfort measures Outcome: Adequate for Discharge   Problem: Health Behavior/Discharge Planning: Goal: Ability to manage health-related needs will improve Outcome: Adequate for Discharge   Problem: Clinical Measurements: Goal: Ability to maintain clinical measurements within normal limits will improve Outcome: Adequate for Discharge Goal: Will remain free from infection Outcome: Adequate for Discharge Goal: Diagnostic test results will improve Outcome: Adequate for Discharge Goal: Respiratory complications will improve Outcome: Adequate for Discharge Goal: Cardiovascular complication will be avoided Outcome: Adequate for Discharge   Problem: Activity: Goal: Risk for activity intolerance will decrease Outcome: Adequate for Discharge   Problem: Nutrition: Goal: Adequate nutrition will be maintained Outcome: Adequate for Discharge   Problem: Coping: Goal: Level of anxiety will decrease Outcome: Adequate for Discharge   Problem: Elimination: Goal: Will not experience complications related to bowel motility Outcome: Adequate for Discharge Goal: Will not experience complications related to urinary retention Outcome: Adequate for Discharge   Problem: Pain Managment: Goal: General experience of comfort will improve Outcome: Adequate for Discharge   Problem: Safety: Goal: Ability to remain free from injury will improve Outcome: Adequate for Discharge   Problem: Skin Integrity: Goal: Risk for impaired skin integrity will decrease Outcome: Adequate for Discharge   Problem: Education: Goal: Knowledge of Childbirth will improve Outcome: Adequate for Discharge Goal: Ability to make informed decisions regarding treatment and plan of care will improve Outcome: Adequate for Discharge Goal: Ability to state  and carry out methods to decrease the pain will improve Outcome: Adequate for Discharge Goal: Individualized Educational Video(s) Outcome: Adequate for Discharge   Problem: Coping: Goal: Ability to verbalize concerns and feelings about labor and delivery will improve Outcome: Adequate for Discharge   Problem: Life Cycle: Goal: Ability to make normal progression through stages of labor will improve Outcome: Adequate for Discharge Goal: Ability to effectively push during vaginal delivery will improve Outcome: Adequate for Discharge   Problem: Role Relationship: Goal: Will demonstrate positive interactions with the child Outcome: Adequate for Discharge   Problem: Safety: Goal: Risk of complications during labor and delivery will decrease Outcome: Adequate for Discharge   Problem: Pain Management: Goal: Relief or control of pain from uterine contractions will improve Outcome: Adequate for Discharge   Problem: Education: Goal: Knowledge of disease or condition will improve Outcome: Adequate for Discharge Goal: Knowledge of the prescribed therapeutic regimen will improve Outcome: Adequate for Discharge   Problem: Fluid Volume: Goal: Peripheral tissue perfusion will improve Outcome: Adequate for Discharge   Problem: Clinical Measurements: Goal: Complications related to disease process, condition or treatment will be avoided or minimized Outcome: Adequate for Discharge   Problem: Education: Goal: Knowledge of condition will improve Outcome: Adequate for Discharge Goal: Individualized Educational Video(s) Outcome: Adequate for Discharge Goal: Individualized Newborn Educational Video(s) Outcome: Adequate for Discharge   Problem: Activity: Goal: Will verbalize the importance of balancing activity with adequate rest periods Outcome: Adequate for Discharge Goal: Ability to tolerate increased activity will improve Outcome: Adequate for Discharge   Problem: Coping: Goal:  Ability to identify and utilize available resources and services will improve Outcome: Adequate for Discharge   Problem: Life Cycle: Goal: Chance of risk for complications during the postpartum period will decrease Outcome: Adequate for Discharge   Problem: Role Relationship: Goal: Ability to demonstrate positive interaction with newborn will improve Outcome: Adequate for Discharge   Problem: Skin Integrity: Goal: Demonstration of wound healing without infection will improve  Outcome: Adequate for Discharge   

## 2020-08-18 NOTE — Progress Notes (Signed)
Discharge education complete, pt verbalizes understanding, all questions answered. Pt alert and oriented. Pt states no pain.

## 2020-08-18 NOTE — Discharge Instructions (Signed)

## 2020-08-20 LAB — SURGICAL PATHOLOGY

## 2020-08-21 ENCOUNTER — Other Ambulatory Visit (HOSPITAL_COMMUNITY): Payer: Medicaid Other

## 2020-08-21 ENCOUNTER — Ambulatory Visit: Payer: Medicaid Other

## 2020-08-21 ENCOUNTER — Ambulatory Visit: Payer: Medicaid Other | Attending: Obstetrics and Gynecology

## 2020-08-23 ENCOUNTER — Ambulatory Visit: Payer: Medicaid Other

## 2020-08-23 ENCOUNTER — Inpatient Hospital Stay (HOSPITAL_COMMUNITY): Payer: Medicaid Other

## 2020-08-23 ENCOUNTER — Inpatient Hospital Stay (HOSPITAL_COMMUNITY)
Admission: AD | Admit: 2020-08-23 | Payer: Medicaid Other | Source: Home / Self Care | Admitting: Obstetrics and Gynecology

## 2020-09-20 ENCOUNTER — Ambulatory Visit: Payer: Medicaid Other | Admitting: Obstetrics and Gynecology

## 2020-10-05 ENCOUNTER — Other Ambulatory Visit: Payer: Self-pay

## 2020-10-05 ENCOUNTER — Encounter: Payer: Self-pay | Admitting: Obstetrics & Gynecology

## 2020-10-05 ENCOUNTER — Ambulatory Visit (INDEPENDENT_AMBULATORY_CARE_PROVIDER_SITE_OTHER): Payer: Medicaid Other | Admitting: Obstetrics & Gynecology

## 2020-10-05 NOTE — Progress Notes (Signed)
..     Post Partum Visit Note  Courtney Torres is a 40 y.o. [phone] female who presents for a postpartum visit. She is 6 weeks postpartum following a normal spontaneous vaginal delivery.  I have fully reviewed the prenatal and intrapartum course. The delivery was at 36.0 gestational weeks.  Anesthesia: epidural. Postpartum course has been good. Baby is doing well. Baby is feeding by bottle - Gerber gentle. Bleeding no bleeding. Bowel function is normal. Bladder function is normal. Patient is sexually active. Contraception method is tubal ligation. Postpartum depression screening: negative.   The pregnancy intention screening data noted above was reviewed. Potential methods of contraception were discussed. The patient elected to proceed with Female Sterilization.      The following portions of the patient's history were reviewed and updated as appropriate: allergies, current medications, past family history, past medical history, past social history, past surgical history and problem list.  Review of Systems Pertinent items are noted in HPI.    Objective:  LMP 12/08/2019 (Exact Date)    General:  alert, cooperative and no distress     Lungs: EFFORT NORMAL  Heart:  RRR  Abdomen: soft, non-tender; bowel sounds normal; no masses,  no organomegaly and incision well healed   Vulva:  not evaluated                       Assessment:    normal postpartum exam. Pap smear not done at today's visit.   Plan:   Essential components of care per ACOG recommendations:  1.  Mood and well being: Patient with negative depression screening today. Reviewed local resources for support.  - Patient does not use tobacco.  - hx of drug use? No   2. Infant care and feeding:  -Patient currently breastmilk feeding? No  -Social determinants of health (SDOH) reviewed in EPIC. No concerns  3. Sexuality, contraception and birth spacing - Patient does not want a pregnancy in the next year.  Desired  family size is 3 children.  - Reviewed forms of contraception in tiered fashion. Patient desired bilateral tubal ligation today.     4. Sleep and fatigue -Encouraged family/partner/community support of 4 hrs of uninterrupted sleep to help with mood and fatigue  5. Physical Recovery  - Discussed patients delivery  - Patient has urinary incontinence? No  - Patient is safe to resume physical and sexual activity  6.  Health Maintenance - Last pap smear done 10/2018 and was normal    7. Chronic Disease- follow BP- PCP follow up  Adam Phenix, MD Center for [facility], [facility]

## 2020-10-05 NOTE — Patient Instructions (Signed)

## 2021-03-16 IMAGING — US US MFM OB TRANSVAGINAL
1 series · 15 of 23 positions shown · non-contrast
Comparison: none

[Series 1: us mfm ob transvaginal · 15 of 23 slices shown]
[im 1/23]
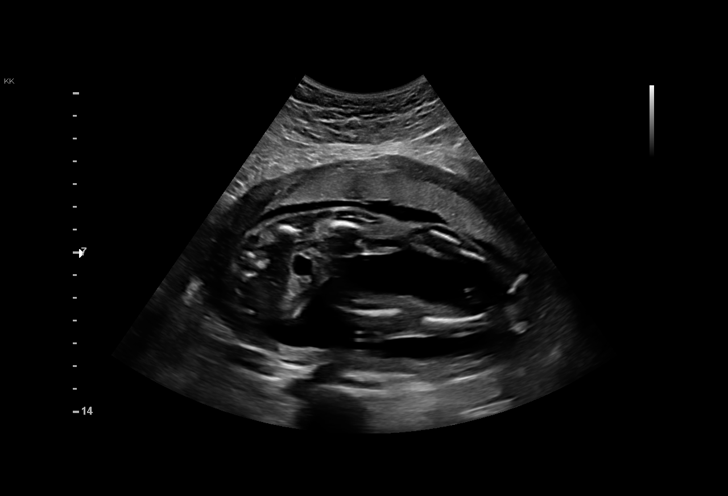
[im 3/23]
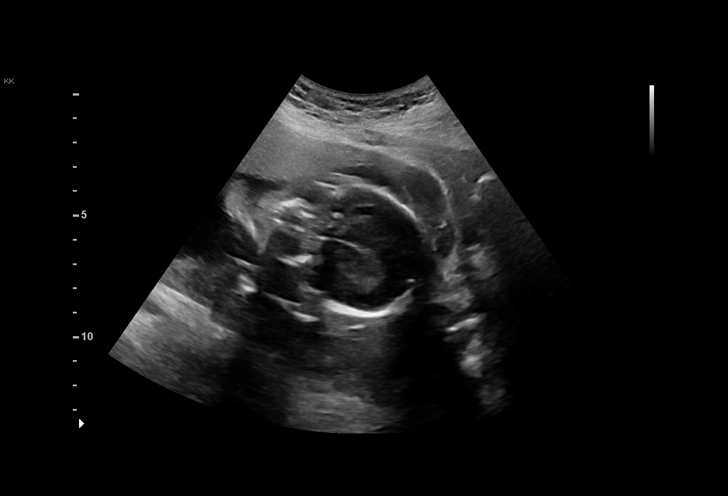
[im 4/23]
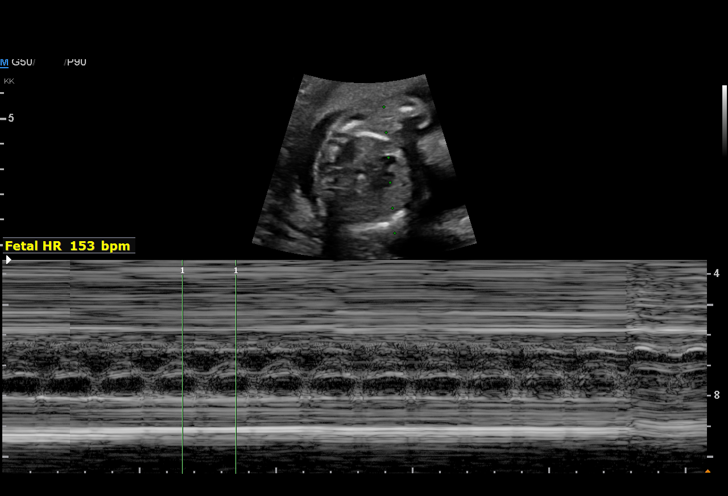
[im 6/23]
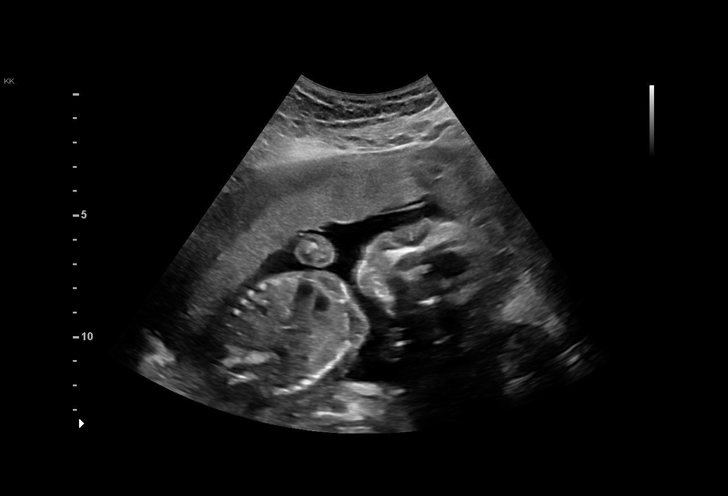
[im 7/23]
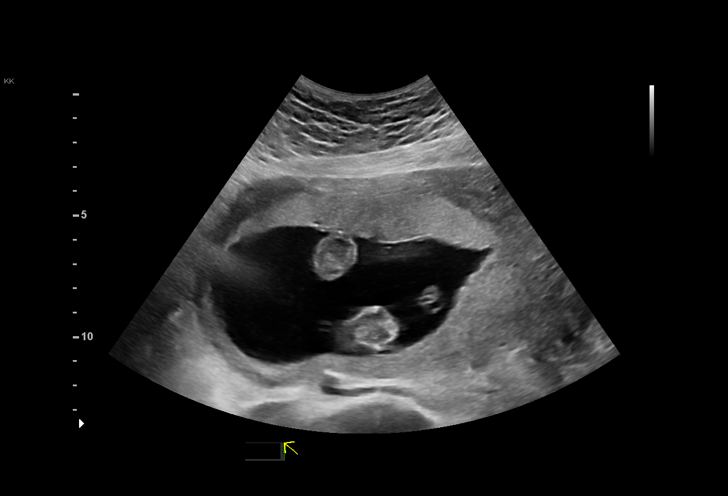
[im 9/23]
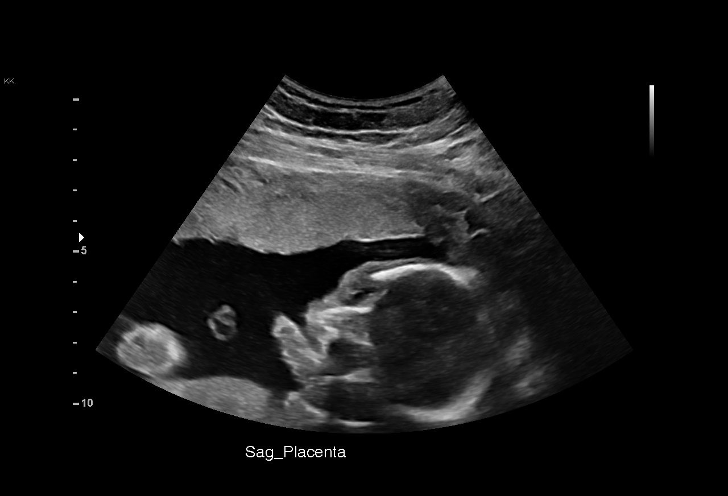
[im 10/23]
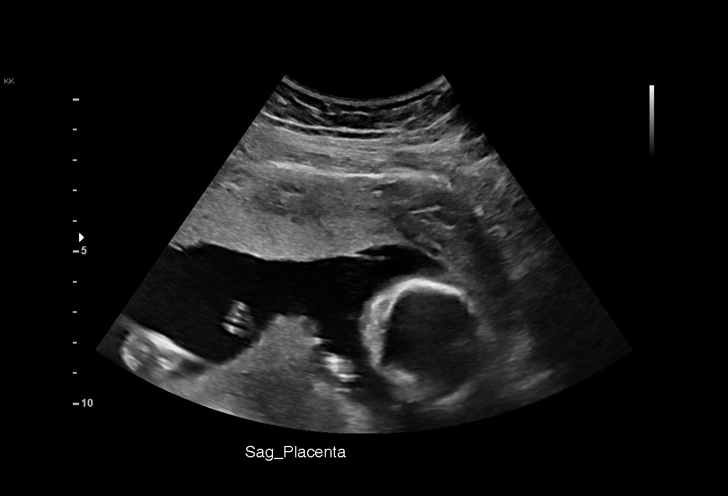
[im 12/23]
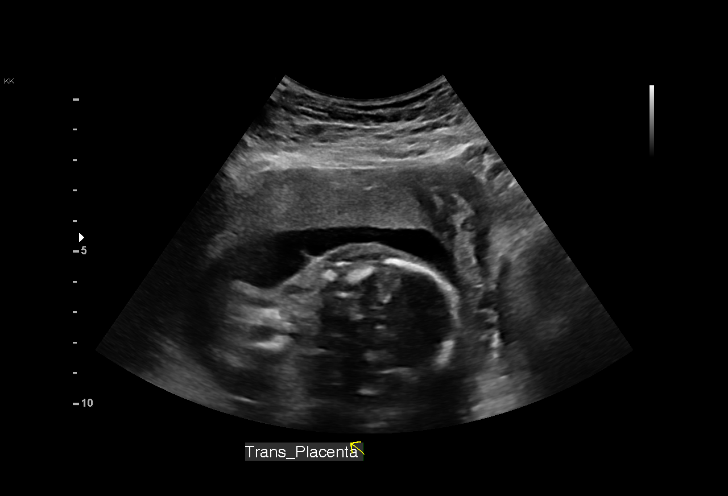
[im 14/23]
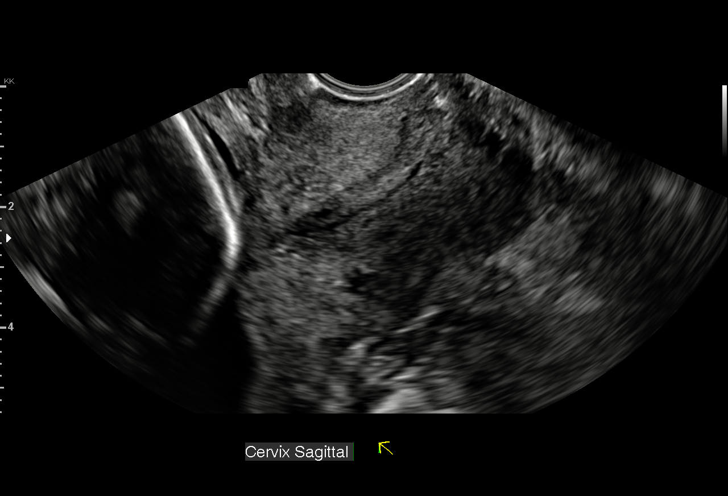
[im 15/23]
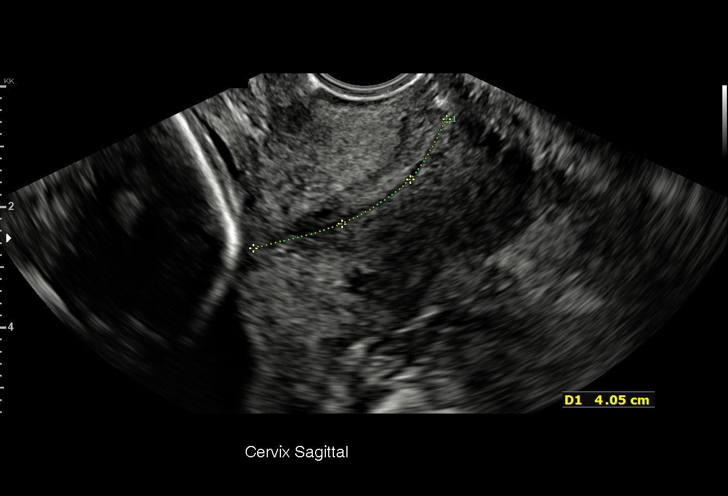
[im 17/23]
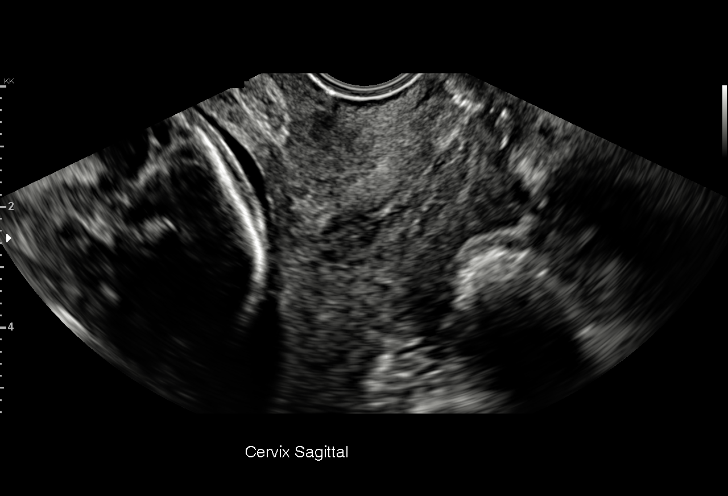
[im 18/23]
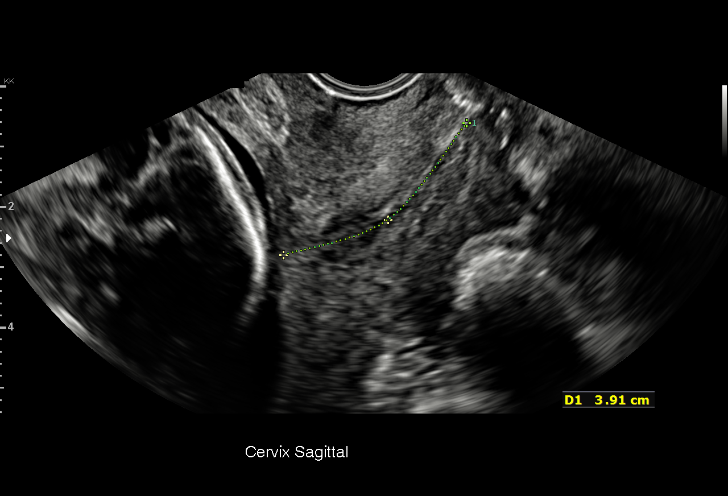
[im 20/23]
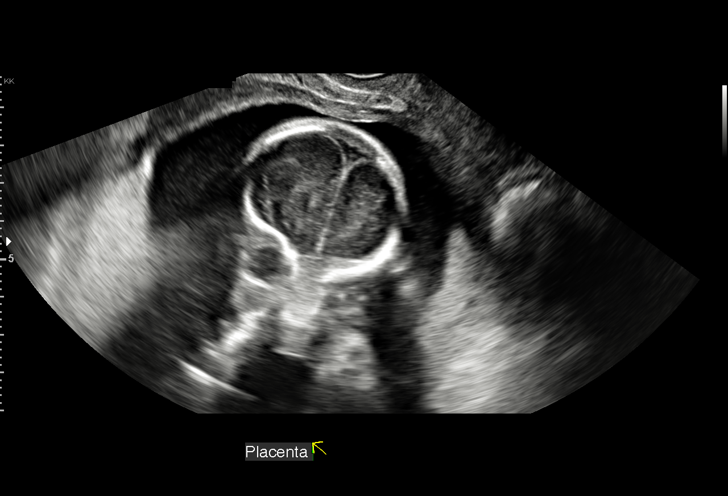
[im 21/23]
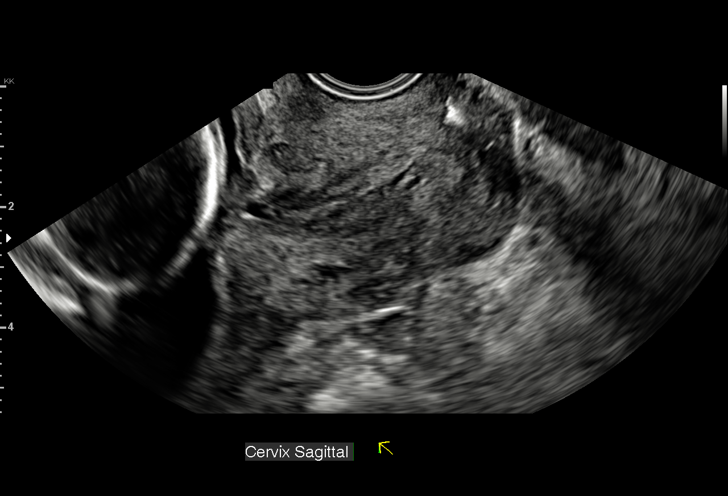
[im 23/23]
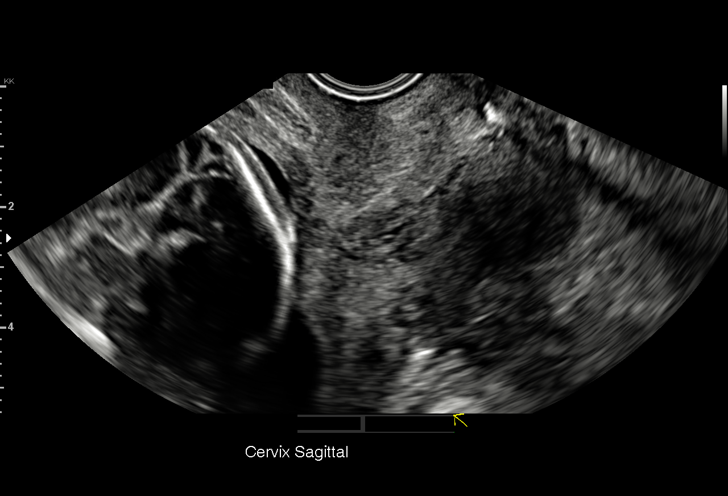

[15 of 23 positions shown; findings below may reference images not displayed]

1  US MFM OB TRANSVAGINAL                76817.2     APRAHEM
                                                      MALART

Indications

 Advanced maternal age multigravida 35+,
 second trimester
 Poor obstetric history: Previous preterm
 delivery, antepartum (x2, 36 & 33w) 1
 vaginal delivery due to preterm labor- on
 Arissa
 Previous cesarean delivery, antepartum
 Previous cervical surgery (LEEP)
 Poor obstetric history: Previous gestational
 HTN
 Other mental disorder complicating
 pregnancy, second trimester (depression
 U7O-B score 9)
 Cystic Fibrosis (CF) Carrier, second trimester
 Encounter for cervical length
 20 weeks gestation of pregnancy
Vital Signs

                                                Height:        5'4"
Fetal Evaluation

 Num Of Fetuses:         1
 Fetal Heart Rate(bpm):  153
 Cardiac Activity:       Observed
 Presentation:           Cephalic
 Placenta:               Anterior
 P. Cord Insertion:      Previously Visualized

 Amniotic Fluid
 AFI FV:      Within normal limits
OB History

 Gravidity:    3         Term:   0        Prem:   2
 Living:       2
Gestational Age

 LMP:           20w 5d        Date:  12/08/19                 EDD:   09/13/20
 Best:          20w 4d     Det. By:  U/S C R L  (02/17/20)    EDD:   09/14/20
Cervix Uterus Adnexa

 Cervix
 Length:            4.3  cm.
 Measured transvaginally.
Impression

 Patient with history of preterm delivery return for cervical
 length measurement.
 Amniotic fluid is normal and good fetal activity is seen .  On
 transvaginal scan, the cervix measures 4.3 cm, which is
 normal.  No shortening was seen on transfundal pressure.

 We reassured the patient of the findings.
Recommendations

 Patient has an appointment for fetal growth assessment at 28
 weeks' gestation.
                 Mantelli, Ivete Maria

## 2021-06-22 IMAGING — US US MFM FETAL BPP W/O NON-STRESS
1 series · 13 of 24 positions shown · non-contrast
Comparison: none

[Series 1: us mfm fetal bpp w/o non-stress · 24 acquisitions, 13 frames shown]
[im 1/24]
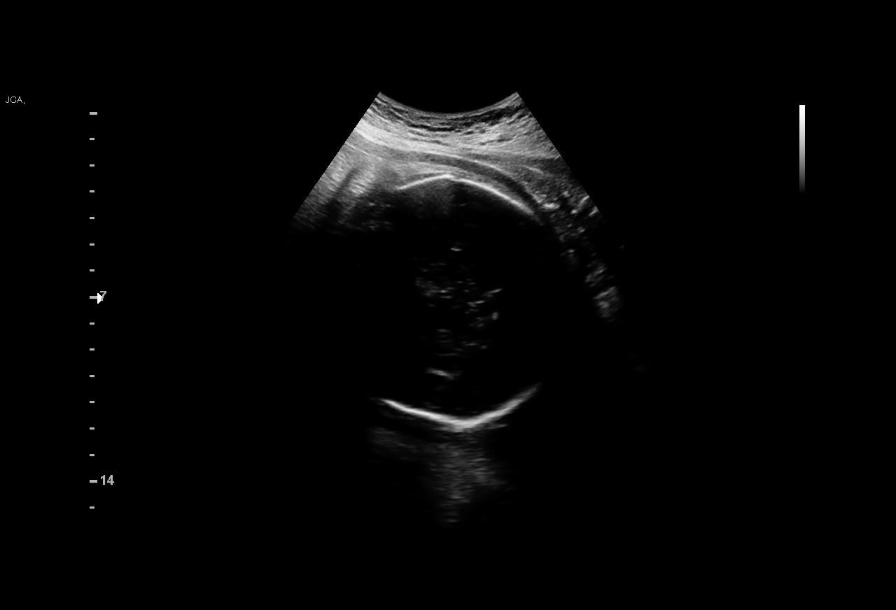
[im 3/24]
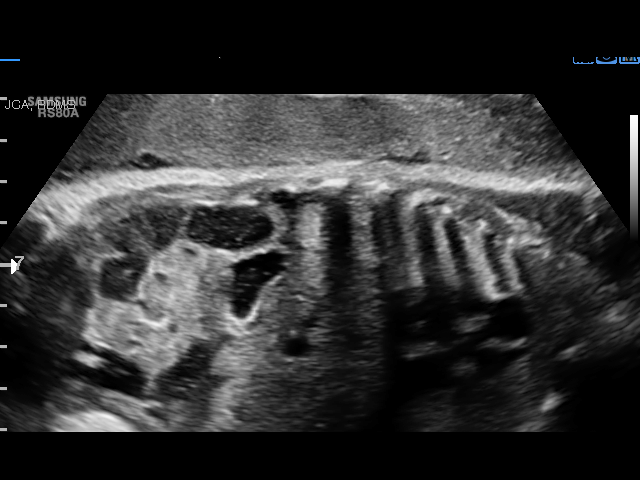
[im 5/24]
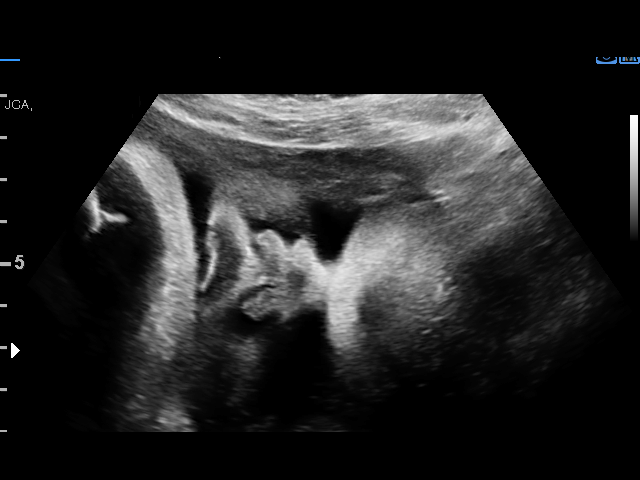
[im 7/24]
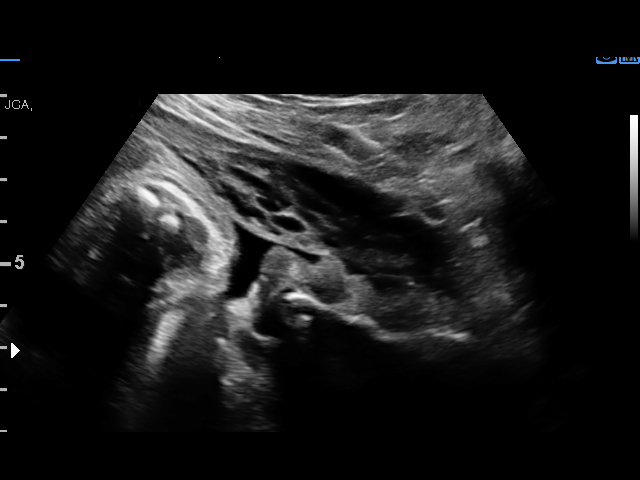
[im 9/24]
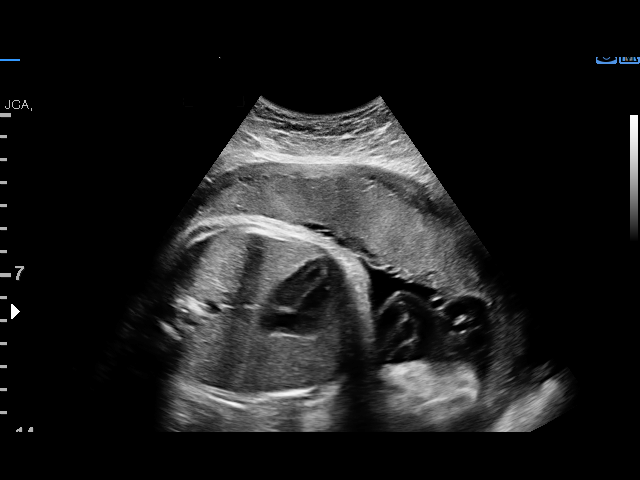
[im 11/24]
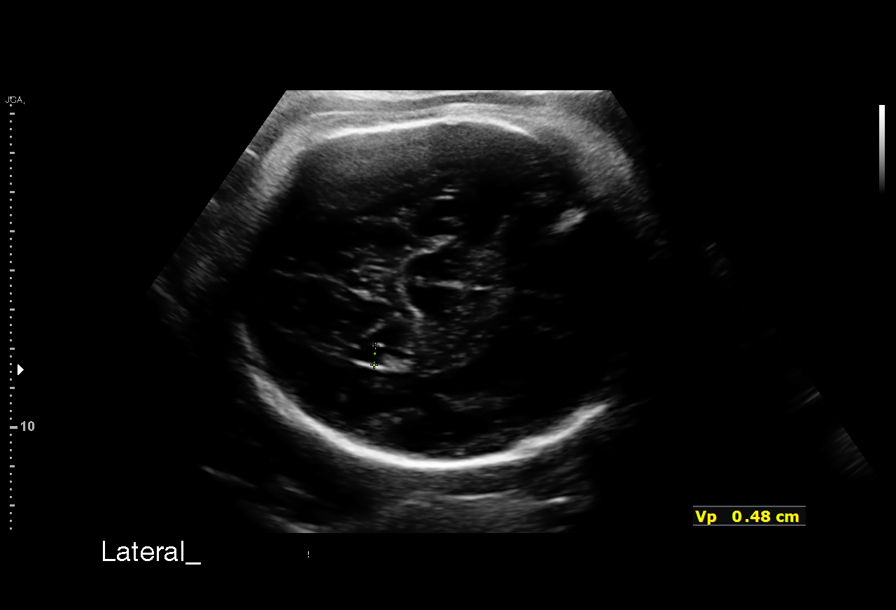
[im 13/24]
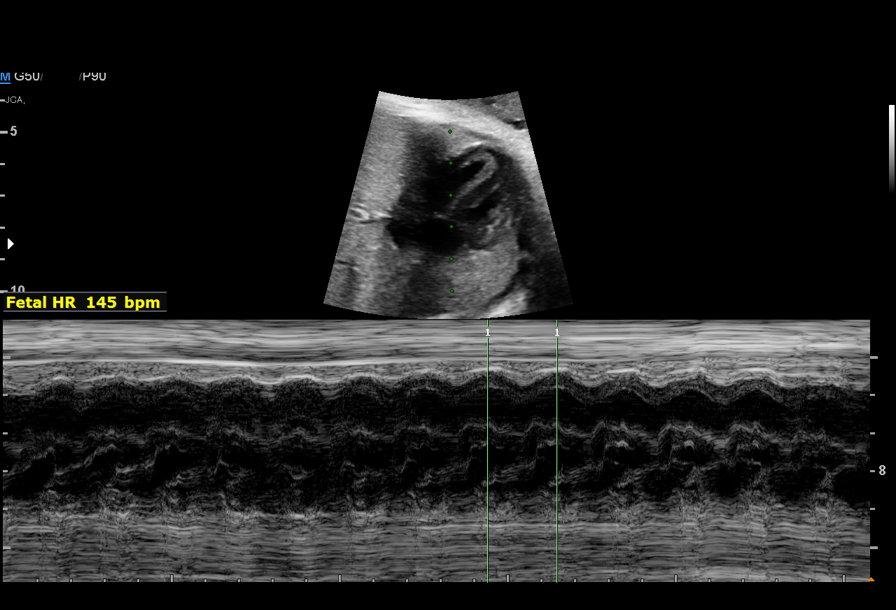
[im 14/24]
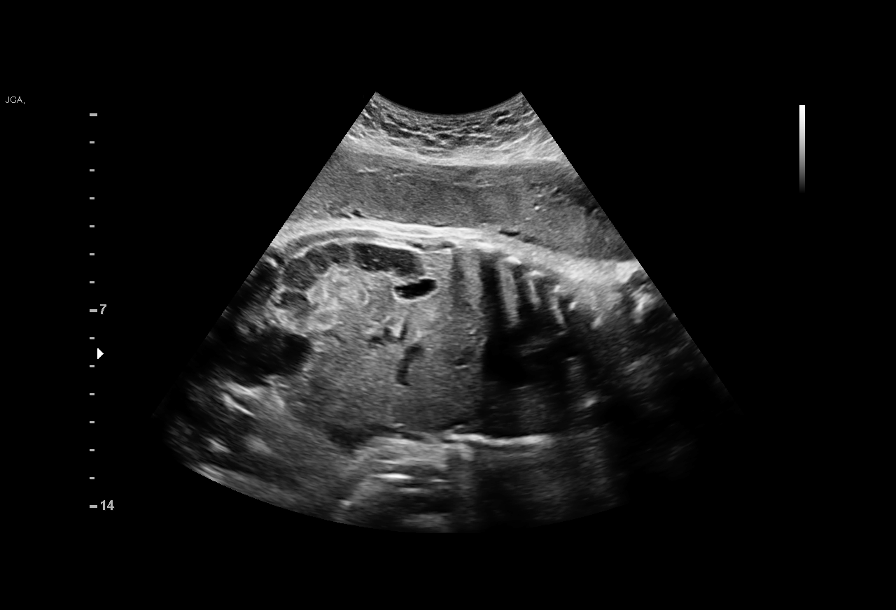
[im 16/24]
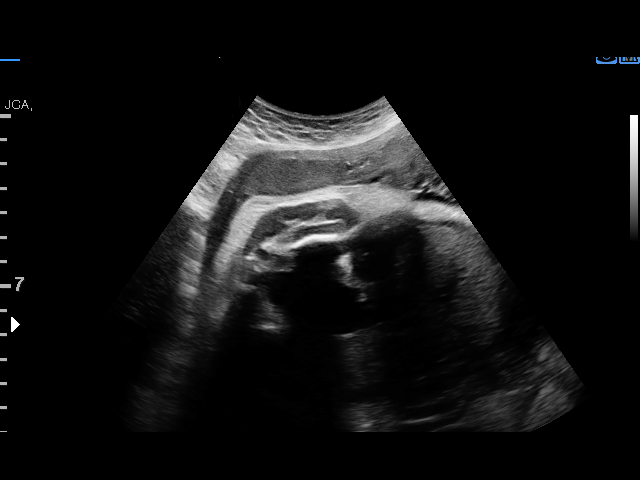
[im 18/24]
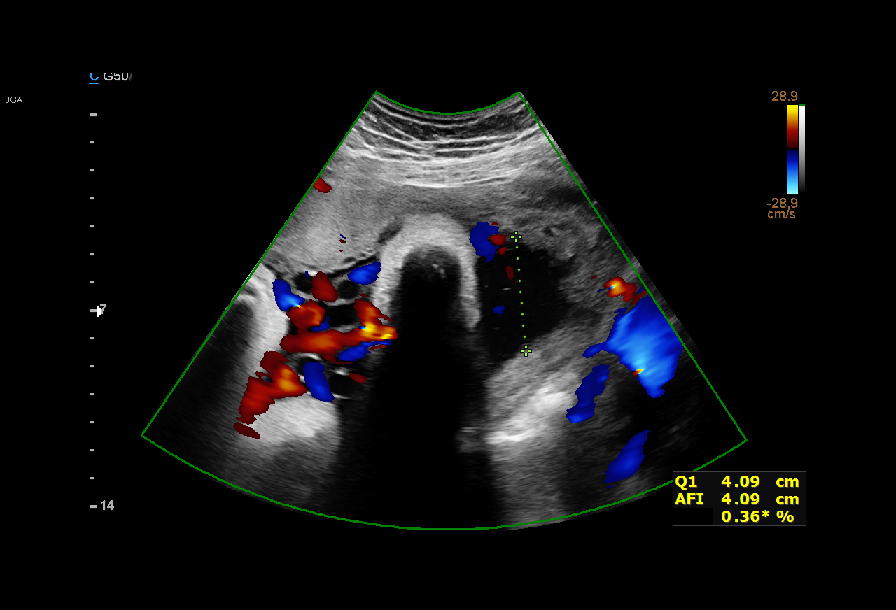
[im 20/24]
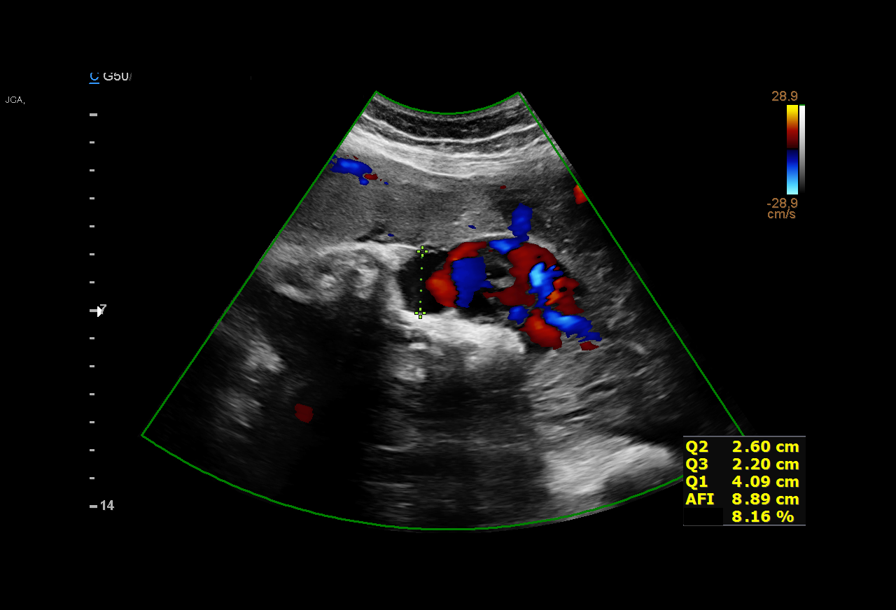
[im 22/24]
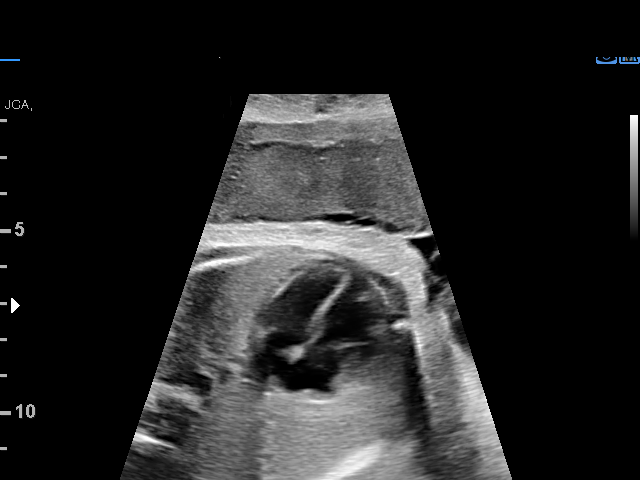
[im 24/24]
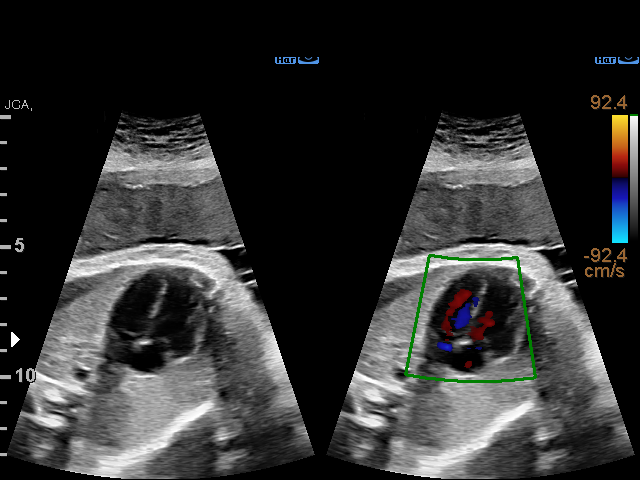

[13 of 24 positions shown; findings below may reference images not displayed]

Indications

 Hypertension - Gestational
 Poor obstetric history: Previous preterm
 delivery, antepartum (x2, 36 & 33w) 1
 vaginal delivery due to preterm labor- on
 Jim
 Previous cesarean delivery, antepartum
 Previous cervical surgery (LEEP)
 Poor obstetric history: Previous gestational
 HTN
 Advanced maternal age multigravida 35+,
 third trimester
 Cystic Fibrosis (CF) Carrier, third trimester
 34 weeks gestation of pregnancy
Vital Signs

                                                Height:        5'4"
Fetal Evaluation

 Num Of Fetuses:         1
 Fetal Heart Rate(bpm):  145
 Cardiac Activity:       Observed
 Presentation:           Cephalic
 Placenta:               Anterior
 P. Cord Insertion:      Previously Visualized
 Amniotic Fluid
 AFI FV:      Within normal limits

 AFI Sum(cm)     %Tile       Largest Pocket(cm)
 12.15           36

 RUQ(cm)       RLQ(cm)       LUQ(cm)        LLQ(cm)

Biophysical Evaluation

 Amniotic F.V:   Pocket => 2 cm             F. Tone:        Observed
 F. Movement:    Observed                   Score:          [DATE]
 F. Breathing:   Observed
Biometry

 LV:        4.8  mm
OB History

 Gravidity:    3         Term:   0        Prem:   2
 Living:       2
Gestational Age

 LMP:           34w 5d        Date:  12/08/19                 EDD:   09/13/20
 Best:          34w 4d     Det. By:  U/S C R L  (02/17/20)    EDD:   09/14/20
Impression

 Antenatal testing performed given maternal gestational
 hypertension
 The biophysical profile was [DATE] with good fetal movement and
 amniotic fluid volume.
Recommendations

 Continue weekly testing
 Plannded delivery on [DATE]

## 2021-09-13 ENCOUNTER — Ambulatory Visit: Payer: Medicaid Other | Admitting: Obstetrics and Gynecology

## 2021-10-16 ENCOUNTER — Telehealth: Payer: Self-pay | Admitting: Obstetrics and Gynecology

## 2021-10-16 ENCOUNTER — Other Ambulatory Visit: Payer: Self-pay

## 2021-10-16 ENCOUNTER — Encounter: Payer: Self-pay | Admitting: Obstetrics and Gynecology

## 2021-10-16 ENCOUNTER — Other Ambulatory Visit (HOSPITAL_COMMUNITY)
Admission: RE | Admit: 2021-10-16 | Discharge: 2021-10-16 | Disposition: A | Discharge status: Home / Self Care | Payer: Medicaid Other | Source: Ambulatory Visit | Attending: Obstetrics and Gynecology | Admitting: Obstetrics and Gynecology

## 2021-10-16 ENCOUNTER — Ambulatory Visit (INDEPENDENT_AMBULATORY_CARE_PROVIDER_SITE_OTHER): Payer: Medicaid Other | Admitting: Obstetrics and Gynecology

## 2021-10-16 DIAGNOSIS — I10 Essential (primary) hypertension: Secondary | ICD-10-CM

## 2021-10-16 DIAGNOSIS — N898 Other specified noninflammatory disorders of vagina: Secondary | ICD-10-CM

## 2021-10-16 DIAGNOSIS — Z01419 Encounter for gynecological examination (general) (routine) without abnormal findings: Secondary | ICD-10-CM

## 2021-10-16 NOTE — Progress Notes (Signed)
Patient presents for AEX. Last pap 10/26/2018 GAD7=14. Reports vaginal discharge that is thick and malodorous. BP elevated in office 155/112, reports headache.

## 2021-10-16 NOTE — Progress Notes (Signed)
Patient ID: Courtney Torres, female   DOB: Apr 10, 1981, 41 y.o.   MRN: [id number]  Courtney Torres is a 41 y.o. [phone] female here for a routine annual gynecologic exam.  Current complaints: vaginal discharge.   Denies abnormal vaginal bleeding, discharge, pelvic pain, problems with intercourse or other gynecologic concerns.    Gynecologic History Patient's last menstrual period was 10/08/2021. Contraception: tubal ligation Last Pap: 3/20. Results were: normal Last mammogram: NA.  Obstetric History OB History  Gravida Para Term Preterm AB Living  3 3   3   3   SAB IAB Ectopic Multiple Live Births        0 3    # Outcome Date GA Lbr Len/2nd Weight Sex Delivery Anes PTL Lv  3 Preterm 08/16/20 100w0d 02:35 / 02:19 7 lb 0.3 oz (3.184 kg) M VBAC EPI  LIV  2 Preterm 01/04/13 [redacted]w[redacted]d  4 lb 10 oz (2.098 kg) M Vag-Spont   LIV  1 Preterm 05/06/02 106w5d  7 lb 14 oz (3.572 kg) F CS-LTranv  Y LIV    Obstetric Comments  Pre-eclampsia with 1st, induced and then CS bc baby was breech    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   Depression    Pregnancy induced hypertension    Preterm labor     Past Surgical History:  Procedure Laterality Date   CERVICAL BIOPSY  W/ LOOP ELECTRODE EXCISION     CESAREAN SECTION     COLPOSCOPY     TONSILLECTOMY AND ADENOIDECTOMY     TUBAL LIGATION N/A 08/16/2020   Procedure: POST PARTUM TUBAL LIGATION;  Surgeon: 08/18/2020, MD;  Location: [facility] LD ORS;  Service: Gynecology;  Laterality: N/A;    Current Outpatient Medications on File Prior to Visit  Medication Sig Dispense Refill   LORazepam (ATIVAN) 0.5 MG tablet Take 0.5 mg by mouth daily as needed.     venlafaxine XR (EFFEXOR-XR) 150 MG 24 hr capsule Take 150 mg by mouth daily.     No current facility-administered medications on file prior to visit.    No Known Allergies  Social History   Socioeconomic History    Marital status: Married    Spouse name: Avery Eustice   Number of children: 2   Years of education: Not on file   Highest education level: Not on file  Occupational History   Occupation: unemployed  Tobacco Use   Smoking status: Former    Packs/day: 0.50    Types: Cigarettes   Smokeless tobacco: Never  Vaping Use   Vaping Use: Never used  Substance and Sexual Activity   Alcohol use: Not Currently    Comment: occ   Drug use: No   Sexual activity: Yes    Birth control/protection: Surgical  Other Topics Concern   Not on file  Social History Narrative   Not on file   Social Determinants of Health   Financial Resource Strain: Not on file  Food Insecurity: Not on file  Transportation Needs: Not on file  Physical Activity: Not on file  Stress: Not on file  Social Connections: Not on file  Intimate Partner Violence: Not on file    Family History  Problem Relation Age of Onset   Hypertension Father    Arthritis Father    Asthma Father    COPD Father    Heart failure Sister    Early death  Sister    Heart disease Sister    Hypertension Brother    Alcohol abuse Brother    Drug abuse Brother    Hypertension Paternal Grandfather    Anxiety disorder Mother    Arthritis Mother    Depression Mother     The following portions of the patient's history were reviewed and updated as appropriate: allergies, current medications, past family history, past medical history, past social history, past surgical history and problem list.  Review of Systems Pertinent items noted in HPI and remainder of comprehensive ROS otherwise negative.   Objective:  BP (!) 155/112    Pulse 83    Ht 5\' 4"  (1.626 m)    Wt 141 lb 3.2 oz (64 kg)    LMP 10/08/2021    BMI 24.24 kg/m  CONSTITUTIONAL: Well-developed, well-nourished female in no acute distress.  HENT:  Normocephalic, atraumatic, External right and left ear normal. Oropharynx is clear and moist EYES: Conjunctivae and EOM are normal.  Pupils are equal, round, and reactive to light. No scleral icterus.  NECK: Normal range of motion, supple, no masses.  Normal thyroid.  SKIN: Skin is warm and dry. No rash noted. Not diaphoretic. No erythema. No pallor. NEUROLGIC: Alert and oriented to person, place, and time. Normal reflexes, muscle tone coordination. No cranial nerve deficit noted. PSYCHIATRIC: Normal mood and affect. Normal behavior. Normal judgment and thought content. CARDIOVASCULAR: Normal heart rate noted, regular rhythm RESPIRATORY: Clear to auscultation bilaterally. Effort and breath sounds normal, no problems with respiration noted. BREASTS: Symmetric in size. No masses, skin changes, nipple drainage, or lymphadenopathy. ABDOMEN: Soft, normal bowel sounds, no distention noted.  No tenderness, rebound or guarding.  PELVIC: Normal appearing external genitalia; normal appearing vaginal mucosa and cervix.  No abnormal discharge noted.  Pap smear obtained.  Normal uterine size, no other palpable masses, no uterine or adnexal tenderness. MUSCULOSKELETAL: Normal range of motion. No tenderness.  No cyanosis, clubbing, or edema.  2+ distal pulses.   Assessment:  Annual gynecologic examination with pap smear Vaginal discharge Hypertension Plan:  Will follow up results of pap smear and manage accordingly. Vaginal swab today Reviewed hypertension. Advised to follow up with PCP ASAP for eval Routine preventative health maintenance measures emphasized. Please refer to After Visit Summary for other counseling recommendations.    10/10/2021, MD, FACOG Attending Obstetrician & Gynecologist Center for [facility], [facility]

## 2021-10-16 NOTE — Patient Instructions (Signed)
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
## 2021-10-17 LAB — CERVICOVAGINAL ANCILLARY ONLY
Bacterial Vaginitis (gardnerella): POSITIVE — AB
Candida Glabrata: NEGATIVE
Candida Vaginitis: NEGATIVE
Chlamydia: NEGATIVE
Comment: NEGATIVE
Comment: NEGATIVE
Comment: NEGATIVE
Comment: NEGATIVE
Comment: NEGATIVE
Comment: NORMAL
Neisseria Gonorrhea: NEGATIVE
Trichomonas: NEGATIVE

## 2021-10-21 ENCOUNTER — Telehealth: Payer: Self-pay

## 2021-10-21 LAB — CYTOLOGY - PAP
Comment: NEGATIVE
Diagnosis: NEGATIVE
High risk HPV: NEGATIVE

## 2021-10-21 MED ORDER — METRONIDAZOLE 500 MG PO TABS: 500.0000 mg | ORAL_TABLET | Freq: Two times a day (BID) | ORAL | 0 refills | Status: AC

## 2021-10-21 NOTE — Telephone Encounter (Signed)
Advised of results and rx sent 

## 2021-10-29 ENCOUNTER — Ambulatory Visit: Payer: Medicaid Other | Admitting: Nurse Practitioner

## 2022-03-26 ENCOUNTER — Emergency Department (HOSPITAL_COMMUNITY): Payer: Medicaid Other

## 2022-03-26 ENCOUNTER — Emergency Department (HOSPITAL_COMMUNITY)
Admission: EM | Admit: 2022-03-26 | Discharge: 2022-03-27 | Discharge status: Against Medical Advice | Payer: Medicaid Other | Attending: Emergency Medicine | Admitting: Emergency Medicine

## 2022-03-26 ENCOUNTER — Other Ambulatory Visit: Payer: Self-pay

## 2022-03-26 ENCOUNTER — Encounter (HOSPITAL_COMMUNITY): Payer: Self-pay

## 2022-03-26 DIAGNOSIS — G501 Atypical facial pain: Secondary | ICD-10-CM | POA: Diagnosis not present

## 2022-03-26 DIAGNOSIS — Z5321 Procedure and treatment not carried out due to patient leaving prior to being seen by health care provider: Secondary | ICD-10-CM | POA: Diagnosis not present
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
## 2022-03-27 ENCOUNTER — Ambulatory Visit
Admission: EM | Admit: 2022-03-27 | Discharge: 2022-03-27 | Disposition: A | Discharge status: Home / Self Care | Payer: Medicaid Other

## 2022-03-27 ENCOUNTER — Emergency Department (HOSPITAL_COMMUNITY): Payer: Medicaid Other

## 2022-03-27 ENCOUNTER — Encounter: Payer: Self-pay | Admitting: Emergency Medicine

## 2022-03-27 DIAGNOSIS — S0990XA Unspecified injury of head, initial encounter: Secondary | ICD-10-CM | POA: Diagnosis not present

## 2022-03-27 DIAGNOSIS — S0181XA Laceration without foreign body of other part of head, initial encounter: Secondary | ICD-10-CM

## 2022-03-27 NOTE — Discharge Instructions (Signed)
Please go to the [facility] urgent care at [facility] for further evaluation and management.
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
## 2022-03-27 NOTE — ED Notes (Signed)
Patient left without being seen ,she stated will not wait.

## 2022-04-30 ENCOUNTER — Ambulatory Visit: Payer: Self-pay

## 2022-04-30 NOTE — Telephone Encounter (Signed)
  Chief Complaint: High BP readings, dizziness Symptoms: ibid Frequency: ongoing Pertinent Negatives: Patient denies chest pain Disposition: [x] ED /[] Urgent Care (no appt availability in office) / [] Appointment(In office/virtual)/ []  [place] [facility]/ [] Home Care/ [] Refused Recommended Disposition /[] [facility] [place] [facility]/ []  Follow-up with PCP Additional Notes: Pt states that she had pre-eclampsia with pregnancy. Pt is not on BP medication. Pt gave several examples of recent BP's. Pt states that she has had many instances of dizziness, and eye pressure.    Reason for Disposition  [1] Systolic BP  >= 160 OR Diastolic >= 100 AND [2] cardiac (e.g., breathing difficulty, chest pain) or neurologic symptoms (e.g., new-onset blurred or double vision, unsteady gait)  Answer Assessment - Initial Assessment Questions 1. BLOOD PRESSURE: "What is the blood pressure?" "Did you take at least two measurements 5 minutes apart?"     160/114,  Previous readings 152/109, 168/137, 152/103, 140/104, 155/114 2. ONSET: "When did you take your blood pressure?"     2 days. 3. HOW: "How did you take your blood pressure?" (e.g., automatic home BP monitor, visiting nurse)     Automatic home 4. HISTORY: "Do you have a history of high blood pressure?"     Preeclampsia 5. MEDICINES: "Are you taking any medicines for blood pressure?" "Have you missed any doses recently?"     no 6. OTHER SYMPTOMS: "Do you have any symptoms?" (e.g., blurred vision, chest pain, difficulty breathing, headache, weakness)     Dizziness, eye pressure 7. PREGNANCY: "Is there any chance you are pregnant?" "When was your last menstrual period?"     no  Protocols used: Blood Pressure - High-A-AH

## 2022-04-30 NOTE — Telephone Encounter (Signed)
Patient was called and there was no answer. Patient may be on the way to ER per triage nurse note

## 2022-05-12 ENCOUNTER — Ambulatory Visit (INDEPENDENT_AMBULATORY_CARE_PROVIDER_SITE_OTHER): Payer: Medicaid Other | Admitting: Family Medicine

## 2022-05-12 ENCOUNTER — Encounter: Payer: Self-pay | Admitting: Family Medicine

## 2022-05-12 VITALS — BP 136/95 | HR 88 | Temp 98.2°F | Resp 18 | Wt 145.4 lb

## 2022-05-12 DIAGNOSIS — I10 Essential (primary) hypertension: Secondary | ICD-10-CM

## 2022-05-12 MED ORDER — LOSARTAN POTASSIUM 50 MG PO TABS: 50.0000 mg | ORAL_TABLET | Freq: Every day | ORAL | 1 refills | Status: DC

## 2022-05-12 NOTE — Progress Notes (Unsigned)
New Patient Office Visit  Subjective    Patient ID: Courtney Torres, female    DOB: 1980/11/06  Age: 41 y.o. MRN: [id number]  CC:  Chief Complaint  Patient presents with   Hypertension    HPI Courtney Torres presents to establish care ***  Outpatient Encounter Medications as of 05/12/2022  Medication Sig   losartan (COZAAR) 50 MG tablet Take 1 tablet (50 mg total) by mouth daily.   ALPRAZolam (XANAX) 0.5 MG tablet Take 0.5 mg by mouth daily as needed.   LORazepam (ATIVAN) 0.5 MG tablet Take 0.5 mg by mouth daily as needed. (Patient not taking: Reported on 05/12/2022)   metroNIDAZOLE (FLAGYL) 500 MG tablet Take 1 tablet (500 mg total) by mouth 2 (two) times daily. (Patient not taking: Reported on 05/12/2022)   QUEtiapine (SEROQUEL) 100 MG tablet Take 100 mg by mouth at bedtime.   venlafaxine XR (EFFEXOR-XR) 150 MG 24 hr capsule Take 150 mg by mouth daily.   venlafaxine XR (EFFEXOR-XR) 75 MG 24 hr capsule Take by mouth.   No facility-administered encounter medications on file as of 05/12/2022.    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   Depression    Pregnancy induced hypertension    Preterm labor     Past Surgical History:  Procedure Laterality Date   CERVICAL BIOPSY  W/ LOOP ELECTRODE EXCISION     CESAREAN SECTION     COLPOSCOPY     TONSILLECTOMY AND ADENOIDECTOMY     TUBAL LIGATION N/A 08/16/2020   Procedure: POST PARTUM TUBAL LIGATION;  Surgeon: Hermina Staggers, MD;  Location: [facility] LD ORS;  Service: Gynecology;  Laterality: N/A;    Family History  Problem Relation Age of Onset   Hypertension Father    Arthritis Father    Asthma Father    COPD Father    Heart failure Sister    Early death Sister    Heart disease Sister    Hypertension Brother    Alcohol abuse Brother    Drug abuse Brother    Hypertension Paternal Grandfather    Anxiety disorder Mother    Arthritis  Mother    Depression Mother     Social History   Socioeconomic History   Marital status: Married    Spouse name: Raylyn Carton   Number of children: 2   Years of education: Not on file   Highest education level: Not on file  Occupational History   Occupation: unemployed  Tobacco Use   Smoking status: Former    Packs/day: 0.50    Types: Cigarettes   Smokeless tobacco: Never  Vaping Use   Vaping Use: Never used  Substance and Sexual Activity   Alcohol use: Not Currently    Comment: occ   Drug use: No   Sexual activity: Yes    Birth control/protection: Surgical  Other Topics Concern   Not on file  Social History Narrative   Not on file   Social Determinants of Health   Financial Resource Strain: Not on file  Food Insecurity: Not on file  Transportation Needs: Not on file  Physical Activity: Not on file  Stress: Not on file  Social Connections: Not on file  Intimate Partner Violence: Not on file    ROS      Objective    BP (!) 136/95   Pulse 88   Temp 98.2 F (36.8  C) (Tympanic)   Resp 18   Wt 145 lb 6.4 oz (66 kg)   SpO2 98%   BMI 24.96 kg/m   Physical Exam  {Labs (Optional):23779}    Assessment & Plan:   Problem List Items Addressed This Visit   None Visit Diagnoses     Essential hypertension    -  Primary   Relevant Medications   losartan (COZAAR) 50 MG tablet       Return in about 4 weeks (around 06/09/2022) for follow up.   Becky Sax, MD

## 2022-05-12 NOTE — Progress Notes (Unsigned)
Patient is her with c/o HTN. Patient said that she takes her bp at home and it's always high. Patient is concern because had HTN with her previous birth 20 months ago

## 2022-05-13 ENCOUNTER — Encounter: Payer: Self-pay | Admitting: Family Medicine

## 2022-06-09 ENCOUNTER — Ambulatory Visit: Payer: Medicaid Other | Admitting: Family Medicine

## 2022-06-24 ENCOUNTER — Ambulatory Visit: Payer: Medicaid Other | Admitting: Family Medicine

## 2022-07-22 ENCOUNTER — Ambulatory Visit: Payer: Medicaid Other | Admitting: Family Medicine

## 2022-12-05 NOTE — Progress Notes (Unsigned)
Patient ID: Courtney Torres, female    DOB: 02-08-1981  MRN: [id number]  CC: Blood Pressure Check  Subjective: Courtney Torres is a 42 y.o. female who presents for blood pressure check.   Her concerns today include:  Patient reports she has not taken Losartan for several months due to needing refills. Home blood pressures above goal 150's-170's/low 100's. She does monitor salt and caffeine intake. She does not smoke. She denies red flag symptoms such as but not limited to chest pain, shortness of breath, worst headache of life, nausea/vomiting. Reports in the past she thinks Losartan may have caused her to have a headache. However, she is unsure because she reports she began an antidepressant around the same time which may have caused the headache. No further issues/concerns for discussion today.    Patient Active Problem List   Diagnosis Date Noted   Visit for routine gyn exam 10/16/2021   Vaginal discharge 10/16/2021   Hypertension 10/16/2021   History of cesarean section 02/17/2020   Generalized anxiety disorder 06/09/2019     Current Outpatient Medications on File Prior to Visit  Medication Sig Dispense Refill   ALPRAZolam (XANAX) 0.5 MG tablet Take 0.5 mg by mouth daily as needed.     QUEtiapine (SEROQUEL) 100 MG tablet Take 100 mg by mouth at bedtime.     venlafaxine XR (EFFEXOR-XR) 150 MG 24 hr capsule Take 150 mg by mouth daily.     venlafaxine XR (EFFEXOR-XR) 75 MG 24 hr capsule Take by mouth.     LORazepam (ATIVAN) 0.5 MG tablet Take 0.5 mg by mouth daily as needed. (Patient not taking: Reported on 05/12/2022)     metroNIDAZOLE (FLAGYL) 500 MG tablet Take 1 tablet (500 mg total) by mouth 2 (two) times daily. (Patient not taking: Reported on 05/12/2022) 14 tablet 0   No current facility-administered medications on file prior to visit.    No Known Allergies  Social History   Socioeconomic History   Marital status: Married    Spouse name: Olesya Wike   Number of  children: 2   Years of education: Not on file   Highest education level: Not on file  Occupational History   Occupation: unemployed  Tobacco Use   Smoking status: Former    Packs/day: .5    Types: Cigarettes   Smokeless tobacco: Never  Vaping Use   Vaping Use: Never used  Substance and Sexual Activity   Alcohol use: Not Currently    Comment: occ   Drug use: No   Sexual activity: Yes    Birth control/protection: Surgical  Other Topics Concern   Not on file  Social History Narrative   Not on file   Social Determinants of Health   Financial Resource Strain: Not on file  Food Insecurity: Not on file  Transportation Needs: Not on file  Physical Activity: Not on file  Stress: Not on file  Social Connections: Not on file  Intimate Partner Violence: Not on file    Family History  Problem Relation Age of Onset   Hypertension Father    Arthritis Father    Asthma Father    COPD Father    Heart failure Sister    Early death Sister    Heart disease Sister    Hypertension Brother    Alcohol abuse Brother    Drug abuse Brother    Hypertension Paternal Grandfather    Anxiety disorder Mother    Arthritis Mother    Depression Mother  Past Surgical History:  Procedure Laterality Date   CERVICAL BIOPSY  W/ LOOP ELECTRODE EXCISION     CESAREAN SECTION     COLPOSCOPY     TONSILLECTOMY AND ADENOIDECTOMY     TUBAL LIGATION N/A 08/16/2020   Procedure: POST PARTUM TUBAL LIGATION;  Surgeon: Hermina Staggers, MD;  Location: [facility] LD [facility]S;  Service: Gynecology;  Laterality: N/A;    ROS: Review of Systems Negative except as stated above  PHYSICAL EXAM: BP (!) 144/95 (BP Location: Left Arm, Patient Position: Sitting, Cuff Size: Normal)   Pulse 95   Temp 97.9 F (36.6 C) (Oral)   Ht 5\' 4"  (1.626 m)   Wt 146 lb (66.2 kg)   LMP 11/24/2022 (Exact Date)   SpO2 97%   BMI 25.06 kg/m    Physical Exam HENT:     Head: Normocephalic and atraumatic.  Eyes:     Extraocular  Movements: Extraocular movements intact.     Conjunctiva/sclera: Conjunctivae normal.     Pupils: Pupils are equal, round, and reactive to light.  Cardiovascular:     Rate and Rhythm: Normal rate and regular rhythm.     Pulses: Normal pulses.     Heart sounds: Normal heart sounds.  Pulmonary:     Effort: Pulmonary effort is normal.     Breath sounds: Normal breath sounds.  Musculoskeletal:     Cervical back: Normal range of motion and neck supple.  Neurological:     General: No focal deficit present.     Mental Status: She is alert and oriented to person, place, and time.  Psychiatric:        Mood and Affect: Mood normal.        Behavior: Behavior normal.    ASSESSMENT AND PLAN: 1. Primary hypertension - Resume Losartan as prescribed. Counseled on medication adherence/adverse effects. - Routine screening.  - Counseled on blood pressure goal of less than 130/80, low-sodium, DASH diet, medication compliance, and 150 minutes of moderate intensity exercise per week as tolerated. Counseled on medication adherence and adverse effects. - Follow-up with primary provider in 2 weeks or sooner if needed for blood pressure check. - Basic Metabolic Panel - losartan (COZAAR) 50 MG tablet; Take 1 tablet (50 mg total) by mouth daily.  Dispense: 30 tablet; Refill: 1   Patient was given the opportunity to ask questions.  Patient verbalized understanding of the plan and was able to repeat key elements of the plan. Patient was given clear instructions to go to Emergency Department or return to medical center if symptoms don't improve, worsen, or new problems develop.The patient verbalized understanding.   Orders Placed This Encounter  Procedures   Basic Metabolic Panel     Requested Prescriptions   Signed Prescriptions Disp Refills   losartan (COZAAR) 50 MG tablet 30 tablet 1    Sig: Take 1 tablet (50 mg total) by mouth daily.    Return in about 2 weeks (around 12/23/2022) for Follow-Up or  next available bp check with Georganna Skeans, MD .  Rema Fendt, NP

## 2022-12-09 ENCOUNTER — Ambulatory Visit (INDEPENDENT_AMBULATORY_CARE_PROVIDER_SITE_OTHER): Payer: Medicaid Other | Admitting: Family

## 2022-12-09 ENCOUNTER — Encounter: Payer: Self-pay | Admitting: Family

## 2022-12-09 VITALS — BP 144/95 | HR 95 | Temp 97.9°F | Ht 64.0 in | Wt 146.0 lb

## 2022-12-09 DIAGNOSIS — I10 Essential (primary) hypertension: Secondary | ICD-10-CM | POA: Diagnosis not present

## 2022-12-09 MED ORDER — LOSARTAN POTASSIUM 50 MG PO TABS: 50.0000 mg | ORAL_TABLET | Freq: Every day | ORAL | 1 refills | Status: DC

## 2022-12-10 LAB — BASIC METABOLIC PANEL
BUN/Creatinine Ratio: 16 (ref 9–23)
BUN: 9 mg/dL (ref 6–20)
CO2: 20 mmol/L (ref 20–29)
Calcium: 9.1 mg/dL (ref 8.7–10.2)
Chloride: 104 mmol/L (ref 96–106)
Creatinine, Ser: 0.56 mg/dL — ABNORMAL LOW (ref 0.57–1.00)
Glucose: 99 mg/dL (ref 70–99)
Potassium: 4.2 mmol/L (ref 3.5–5.2)
Sodium: 137 mmol/L (ref 134–144)
eGFR: 119 mL/min/{1.73_m2} (ref >59)

## 2023-01-12 ENCOUNTER — Ambulatory Visit: Payer: Medicaid Other | Admitting: Family Medicine

## 2023-02-12 ENCOUNTER — Other Ambulatory Visit: Payer: Self-pay | Admitting: Family Medicine

## 2023-02-12 DIAGNOSIS — I10 Essential (primary) hypertension: Secondary | ICD-10-CM

## 2023-02-12 MED ORDER — LOSARTAN POTASSIUM 50 MG PO TABS: 50.0000 mg | ORAL_TABLET | Freq: Every day | ORAL | 2 refills | Status: DC

## 2023-02-12 NOTE — Telephone Encounter (Signed)
Requested Prescriptions  Pending Prescriptions Disp Refills   losartan (COZAAR) 50 MG tablet 30 tablet 1    Sig: Take 1 tablet (50 mg total) by mouth daily.     Cardiovascular:  Angiotensin Receptor Blockers Failed - 02/12/2023  3:23 PM      Failed - Cr in normal range and within 180 days    Creatinine, Ser  Date Value Ref Range Status  12/09/2022 0.56 (L) 0.57 - 1.00 mg/dL Final   Creatinine, Urine  Date Value Ref Range Status  08/15/2020 58.22 mg/dL Final         Failed - Last BP in normal range    BP Readings from Last 1 Encounters:  12/09/22 (!) 144/95         Passed - K in normal range and within 180 days    Potassium  Date Value Ref Range Status  12/09/2022 4.2 3.5 - 5.2 mmol/L Final         Passed - Patient is not pregnant      Passed - Valid encounter within last 6 months    Recent Outpatient Visits           2 months ago Primary hypertension   [facility], [place], NP   9 months ago Essential hypertension   [facility], MD   3 years ago Insomnia, unspecified type   [place] [facility], MD   3 years ago Generalized anxiety disorder    [facility] Care at [facility], Charlcie Cradle, MD   3 years ago Needs flu shot   [facility], MD       Future Appointments             In 1 week Rema Fendt, NP [facility]

## 2023-02-12 NOTE — Telephone Encounter (Signed)
Medication Refill - Medication: losartan (COZAAR) 50 MG tablet [[id number]]   Has the patient contacted their pharmacy? Yes.   (Agent: If no, request that the patient contact the pharmacy for the refill. If patient does not wish to contact the pharmacy document the reason why and proceed with request.) (Agent: If yes, when and what did the pharmacy advise?)  Preferred Pharmacy (with phone number or street name):  [facility] - [place], [place] - [address] Phone: [phone]  Fax: [phone]     Has the patient been seen for an appointment in the last year OR does the patient have an upcoming appointment? Yes.    Agent: Please be advised that RX refills may take up to 3 business days. We ask that you follow-up with your pharmacy.

## 2023-02-19 ENCOUNTER — Ambulatory Visit: Payer: Medicaid Other | Admitting: Family

## 2023-05-18 ENCOUNTER — Ambulatory Visit: Payer: Self-pay | Admitting: *Deleted

## 2023-05-18 NOTE — Telephone Encounter (Signed)
Reason for Disposition  [1] Palpitations AND [2] no improvement after using Care Advice  Answer Assessment - Initial Assessment Questions 1. DESCRIPTION: "Please describe your heart rate or heartbeat that you are having" (e.g., fast/slow, regular/irregular, skipped or extra beats, "palpitations")     Palpitations 2. ONSET: "When did it start?" (Minutes, hours or days)      2 weeks- patient has been out of town 1 month 3. DURATION: "How long does it last" (e.g., seconds, minutes, hours)     Sometimes 5 minutes- causes constant pain in chest- patient has anxiety 4. PATTERN "Does it come and go, or has it been constant since it started?"  "Does it get worse with exertion?"   "Are you feeling it now?"     Comes and goes, last occurrence -yesterday, not now 5. TAP: "Using your hand, can you tap out what you are feeling on a chair or table in front of you, so that I can hear?" (Note: not all patients can do this)       na 6. HEART RATE: "Can you tell me your heart rate?" "How many beats in 15 seconds?"  (Note: not all patients can do this)       Checks with watch- HR- resting- 70-80, can go up to 105-10 7. RECURRENT SYMPTOM: "Have you ever had this before?" If Yes, ask: "When was the last time?" and "What happened that time?"      Yes- out of BP medication , ran out Saturday- is occurring even with BP medication  8. CAUSE: "What do you think is causing the palpitations?"     Not sure 9. CARDIAC HISTORY: "Do you have any history of heart disease?" (e.g., heart attack, angina, bypass surgery, angioplasty, arrhythmia)      Hx possible abnormal EKG in the past 10. OTHER SYMPTOMS: "Do you have any other symptoms?" (e.g., dizziness, chest pain, sweating, difficulty breathing)       Anxiety hx- all those symptoms can occur  Protocols used: Heart Rate and Heartbeat Questions-A-AH

## 2023-05-18 NOTE — Telephone Encounter (Signed)
  Chief Complaint: palpitations  Symptoms: irregular heart rate- patient has been out of medication since Saturday- but reports her symptoms have been occurring longer Frequency: comes and goes- at least 2 weeks Pertinent Negatives: Patient denies chest pain, sweating, difficulty breathing  Disposition: [] ED /[x] Urgent Care (no appt availability in office) / [] Appointment(In office/virtual)/ []  [place] [facility]/ [] Home Care/ [] Refused Recommended Disposition /[] [facility]/ []  Follow-up with PCP Additional Notes: No open appointment- patient needs to be scheduled- she is going to UC for her symptoms

## 2023-05-20 ENCOUNTER — Ambulatory Visit
Admission: EM | Admit: 2023-05-20 | Discharge: 2023-05-20 | Disposition: A | Discharge status: Home / Self Care | Payer: Medicaid Other | Attending: Internal Medicine | Admitting: Internal Medicine

## 2023-05-20 ENCOUNTER — Other Ambulatory Visit: Payer: Self-pay

## 2023-05-20 ENCOUNTER — Encounter: Payer: Self-pay | Admitting: *Deleted

## 2023-05-20 DIAGNOSIS — R079 Chest pain, unspecified: Secondary | ICD-10-CM | POA: Diagnosis not present

## 2023-05-20 NOTE — ED Notes (Signed)
Patient is being discharged from the Urgent Care and sent to the Emergency Department via pov . Per Ervin Knack, NP, patient is in need of higher level of care due to evaluation of chest pain. Patient is aware and verbalizes understanding of plan of care.  Vitals:   05/20/23 1052  BP: (!) 155/104  Pulse: 76  Resp: 18  Temp: 98.5 F (36.9 C)  SpO2: 99%

## 2023-05-20 NOTE — ED Provider Notes (Addendum)
[name]    CSN: [id number] Arrival date & time: 05/20/23  1031      History   Chief Complaint Chief Complaint  Patient presents with   Chest Pain    HPI Courtney Torres is a 42 y.o. female.   Patient presents with intermittent chest pain that has been present for approximately 2 weeks.  Reports that she last felt it early this morning.  States it typically occurs with any type of exertion.  She also has intermittent shortness of breath.  States intermittent dizziness as well but denies headache, nausea, vomiting.  Patient denies any previous chest pain or cardiac history but reports that her sister died at the age of 78 after a heart attack.  She is also requesting a refill on her losartan blood pressure medication as she has been out of it for approximately 4-5 days.  She measures her blood pressure at home while on the medication and it is typically 140s to 150s systolic.  Attempting to get in with her PCP today who advised her to go to the emergency department but she came to urgent care instead.   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   Depression    Pregnancy induced hypertension    Preterm labor     Patient Active Problem List   Diagnosis Date Noted   Visit for routine gyn exam 10/16/2021   Vaginal discharge 10/16/2021   Hypertension 10/16/2021   History of cesarean section 02/17/2020   Generalized anxiety disorder 06/09/2019    Past Surgical History:  Procedure Laterality Date   CERVICAL BIOPSY  W/ LOOP ELECTRODE EXCISION     CESAREAN SECTION     COLPOSCOPY     TONSILLECTOMY AND ADENOIDECTOMY     TUBAL LIGATION N/A 08/16/2020   Procedure: POST PARTUM TUBAL LIGATION;  Surgeon: Hermina Staggers, MD;  Location: [facility] LD ORS;  Service: Gynecology;  Laterality: N/A;    OB History     Gravida  3   Para  3   Term      Preterm  3   AB       Living  3      SAB      IAB      Ectopic      Multiple  0   Live Births  3        Obstetric Comments  Pre-eclampsia with 1st, induced and then CS bc baby was breech          Home Medications    Prior to Admission medications   Medication Sig Start Date End Date Taking? Authorizing Provider  ALPRAZolam Prudy Feeler) 0.5 MG tablet Take 0.5 mg by mouth daily as needed. 05/08/22  Yes [provider]  QUEtiapine (SEROQUEL) 100 MG tablet Take 100 mg by mouth at bedtime. 04/10/22  Yes [provider]  venlafaxine XR (EFFEXOR-XR) 150 MG 24 hr capsule Take 150 mg by mouth daily. 10/14/21  Yes [provider]  venlafaxine XR (EFFEXOR-XR) 75 MG 24 hr capsule Take by mouth. 04/10/22  Yes [provider]  LORazepam (ATIVAN) 0.5 MG tablet Take 0.5 mg by mouth daily as needed. Patient not taking: Reported on 05/12/2022 09/25/21   [provider]  losartan (COZAAR) 50 MG tablet Take 1 tablet (50 mg total) by mouth daily. Patient taking differently: Take 100 mg by  mouth daily. 02/12/23   Rema Fendt, NP  metroNIDAZOLE (FLAGYL) 500 MG tablet Take 1 tablet (500 mg total) by mouth 2 (two) times daily. Patient not taking: Reported on 05/12/2022 10/21/21   Hermina Staggers, MD    Family History Family History  Problem Relation Age of Onset   Hypertension Father    Arthritis Father    Asthma Father    COPD Father    Heart failure Sister    Early death Sister    Heart disease Sister    Hypertension Brother    Alcohol abuse Brother    Drug abuse Brother    Hypertension Paternal Grandfather    Anxiety disorder Mother    Arthritis Mother    Depression Mother     Social History Social History   Tobacco Use   Smoking status: Former    Current packs/day: 0.50    Types: Cigarettes   Smokeless tobacco: Never  Vaping Use   Vaping status: Some Days  Substance Use Topics   Alcohol use: Not Currently    Comment: occ   Drug use: No      Allergies   Patient has no known allergies.   Review of Systems Review of Systems Per HPI  Physical Exam Triage Vital Signs ED Triage Vitals [05/20/23 1052]  Encounter Vitals Group     BP (!) 155/104     Systolic BP Percentile      Diastolic BP Percentile      Pulse Rate 76     Resp 18     Temp 98.5 F (36.9 C)     Temp Source Oral     SpO2 99 %     Weight      Height      Head Circumference      Peak Flow      Pain Score 7     Pain Loc      Pain Education      Exclude from Growth Chart    No data found.  Updated Vital Signs BP (!) 155/104 (BP Location: Left Arm)   Pulse 76   Temp 98.5 F (36.9 C) (Oral)   Resp 18   LMP 04/19/2023 (Approximate)   SpO2 99%   Visual Acuity Right Eye Distance:   Left Eye Distance:   Bilateral Distance:    Right Eye Near:   Left Eye Near:    Bilateral Near:     Physical Exam Constitutional:      General: She is not in acute distress.    Appearance: Normal appearance. She is not toxic-appearing or diaphoretic.  HENT:     Head: Normocephalic and atraumatic.  Eyes:     Extraocular Movements: Extraocular movements intact.     Conjunctiva/sclera: Conjunctivae normal.  Cardiovascular:     Rate and Rhythm: Normal rate and regular rhythm.     Pulses: Normal pulses.     Heart sounds: Normal heart sounds.  Pulmonary:     Effort: Pulmonary effort is normal. No respiratory distress.     Breath sounds: Normal breath sounds.  Neurological:     General: No focal deficit present.     Mental Status: She is alert and oriented to person, place, and time. Mental status is at baseline.  Psychiatric:        Mood and Affect: Mood normal.        Behavior: Behavior normal.        Thought Content: Thought content normal.  Judgment: Judgment normal.      UC Treatments / Results  Labs (all labs ordered are listed, but only abnormal results are displayed) Labs Reviewed - No data to display  EKG   Radiology No  results found.  Procedures Procedures (including critical care time)  Medications Ordered in UC Medications - No data to display  Initial Impression / Assessment and Plan / UC Course  I have reviewed the triage vital signs and the nursing notes.  Pertinent labs & imaging results that were available during my care of the patient were reviewed by me and considered in my medical decision making (see chart for details).     EKG completed that was unremarkable.  Although, given chest pain occurs with exertion, elevated blood pressure reading, and significant family history of cardiac etiology, recommended that she go to the emergency department for more adequate and extensive evaluation than can be provided here in urgent care.  Patient was agreeable with plan.  Vital signs and the patient stable at discharge.  Agree with patient self transport to the ER. Will defer BP medication refill to ED.  Final Clinical Impressions(s) / UC Diagnoses   Final diagnoses:  Chest pain, unspecified type     Discharge Instructions      Please go to the emergency department for further evaluation and management.    ED Prescriptions   None    PDMP not reviewed this encounter.   Gustavus Bryant, [place] 05/20/23 [address], [place] 05/20/23 1119

## 2023-05-20 NOTE — ED Triage Notes (Signed)
Reports out of her bp meds since Saturday and also out of refills. Could not get in with her PCP Courtney Torres PCP) until later in the week and they wanted her to come in for an EKG. States her sister had a heart attack at the age of 9

## 2023-05-20 NOTE — Discharge Instructions (Signed)
Please go to the emergency department for further evaluation and management.

## 2023-05-25 ENCOUNTER — Ambulatory Visit: Payer: Self-pay | Admitting: *Deleted

## 2023-05-25 ENCOUNTER — Other Ambulatory Visit: Payer: Self-pay | Admitting: Family Medicine

## 2023-05-25 DIAGNOSIS — I10 Essential (primary) hypertension: Secondary | ICD-10-CM

## 2023-05-25 NOTE — Telephone Encounter (Signed)
Medication Refill - Medication: losartan (COZAAR) 50 MG tablet [[id number]]   Has the patient contacted their pharmacy? Yes.   (Agent: If no, request that the patient contact the pharmacy for the refill. If patient does not wish to contact the pharmacy document the reason why and proceed with request.) (Agent: If yes, when and what did the pharmacy advise?)  Preferred Pharmacy (with phone number or street name):  [facility] [place], [place] - [address] Phone: [phone]  Fax: [phone]     Has the patient been seen for an appointment in the last year OR does the patient have an upcoming appointment? Yes.    Agent: Please be advised that RX refills may take up to 3 business days. We ask that you follow-up with your pharmacy.

## 2023-05-25 NOTE — Telephone Encounter (Signed)
  Chief Complaint: elevated BP calling to requesting refill medication to be sent to [facility] [address] in [place]. Symptoms: denies sx now. Has not taken BP today reports last taken BP 172/109 and 165/120. Did have blurred vision headache. Has been out of medication x 4-5 days. Took BC today and sx gone . Does not have BP monitor now to recheck BP for NT. Seen in ED 05/20/23 Frequency: since 05/20/23 Pertinent Negatives: Patient denies chest pain no difficulty breathing no blurred vision. No other neurological sx  Disposition: [] ED /[] Urgent Care (no appt availability in office) / [] Appointment(In office/virtual)/ []  [place] [facility]/ [] Home Care/ [] Refused Recommended Disposition /[x] [place] [facility]/ []  Follow-up with PCP Additional Notes:   Recommended to recheck BP offered mobile bus unsure patient will go or go to UC due to child care issues. Please advise. Appt scheduled for earliest appt 06/17/23 with PCP. Patient reports in ED recommended f/u for more medication or alternative for BP control. Please advise for earlier appt.       Reason for Disposition  Ran out of BP medications  Answer Assessment - Initial Assessment Questions 1. BLOOD PRESSURE: "What is the blood pressure?" "Did you take at least two measurements 5 minutes apart?"     BP 172/109 BP 165/120  2. ONSET: "When did you take your blood pressure?"     Not today  3. HOW: "How did you take your blood pressure?" (e.g., automatic home BP monitor, visiting nurse)     na 4. HISTORY: "Do you have a history of high blood pressure?"     Yes  5. MEDICINES: "Are you taking any medicines for blood pressure?" "Have you missed any doses recently?"     Out of medication x 4-5 days  6. OTHER SYMPTOMS: "Do you have any symptoms?" (e.g., blurred vision, chest pain, difficulty breathing, headache, weakness)     Denies sx now was c/o dizziness, headache, blurred vision. Took a BC and feels better.  7. PREGNANCY:  "Is there any chance you are pregnant?" "When was your last menstrual period?"     na  Protocols used: Blood Pressure - High-A-AH

## 2023-05-26 MED ORDER — LOSARTAN POTASSIUM 50 MG PO TABS: 50.0000 mg | ORAL_TABLET | Freq: Every day | ORAL | 0 refills | Status: DC

## 2023-05-26 NOTE — Telephone Encounter (Signed)
Requested Prescriptions  Pending Prescriptions Disp Refills   losartan (COZAAR) 50 MG tablet 90 tablet 0    Sig: Take 1 tablet (50 mg total) by mouth daily.     Cardiovascular:  Angiotensin Receptor Blockers Failed - 05/25/2023  2:54 PM      Failed - Cr in normal range and within 180 days    Creatinine, Ser  Date Value Ref Range Status  12/09/2022 0.56 (L) 0.57 - 1.00 mg/dL Final   Creatinine, Urine  Date Value Ref Range Status  08/15/2020 58.22 mg/dL Final         Failed - Last BP in normal range    BP Readings from Last 1 Encounters:  05/20/23 (!) 155/104         Passed - K in normal range and within 180 days    Potassium  Date Value Ref Range Status  12/09/2022 4.2 3.5 - 5.2 mmol/L Final         Passed - Patient is not pregnant      Passed - Valid encounter within last 6 months    Recent Outpatient Visits           5 months ago Primary hypertension   [facility] [place], NP   1 year ago Essential hypertension   [facility], MD   3 years ago Insomnia, unspecified type   [place] [facility], MD   3 years ago Generalized anxiety disorder   [place] [facility], Charlcie Cradle, MD   4 years ago Needs flu shot   [facility], MD       Future Appointments             In 3 weeks Georganna Skeans, MD [facility]

## 2023-05-26 NOTE — Telephone Encounter (Signed)
Med refill video if we have it early appointment

## 2023-05-28 ENCOUNTER — Ambulatory Visit: Payer: Self-pay | Admitting: *Deleted

## 2023-05-28 NOTE — Telephone Encounter (Signed)
  Chief Complaint: medication clarification. Requesting what dose of losartan should be taking now? In chart 05/26/23 losartan 50 mg daily  Symptoms: denies sx now  Frequency: na Pertinent Negatives: Patient denies na Disposition: [] ED /[] Urgent Care (no appt availability in office) / [] Appointment(In office/virtual)/ []  [place] [facility]/ [x] Home Care/ [] Refused Recommended Disposition /[] [place] [facility]/ []  Follow-up with PCP Additional Notes:   Instructed patient on med list shows to take losartan (cozaar)  take 1 tablet (50 mg) by mouth daily ordered on 05/26/23. Recommended if sx return after continued dose call back .    Summary: med management   Pt asked me to verify what MG was sent by PCP for losartan (COZAAR) the last time she sent it in. Adviced back in 06/20 50 MG tablet.  Pt stated she was under the impression it should be 100 MG.  Pt seeking clinical advice.             Reason for Disposition  Caller has medicine question only, adult not sick, AND triager answers question  Answer Assessment - Initial Assessment Questions 1. NAME of MEDICINE: "What medicine(s) are you calling about?"     Losartan (cozaar)  2. QUESTION: "What is your question?" (e.g., double dose of medicine, side effect)     How many mg should I be taking? 3. PRESCRIBER: "Who prescribed the medicine?" Reason: if prescribed by specialist, call should be referred to that group.     Dr. Andrey Campanile 4. SYMPTOMS: "Do you have any symptoms?" If Yes, ask: "What symptoms are you having?"  "How bad are the symptoms (e.g., mild, moderate, severe)     On 05/20/23 went to UC and recommended maybe to increase dose of medication due to elevated BP. Patient does state that she had been out of medication x 4-5 days prior . Was recommended by UC to go to ED and patient did not for c/o chest pain. 5. PREGNANCY:  "Is there any chance that you are pregnant?" "When was your last menstrual period?"      na  Protocols used: Medication Question Call-A-AH

## 2023-05-28 NOTE — Telephone Encounter (Signed)
Summary: med management   Pt asked me to verify what MG was sent by PCP for losartan (COZAAR) the last time she sent it in. Adviced back in 06/20 50 MG tablet.  Pt stated she was under the impression it should be 100 MG.  Pt seeking clinical advice.      Called patient [phone] to review medication questions. No answer, VM box has not been set up per recording. Unable to leave message .

## 2023-06-04 ENCOUNTER — Encounter: Payer: Self-pay | Admitting: *Deleted

## 2023-06-04 ENCOUNTER — Other Ambulatory Visit: Payer: Self-pay

## 2023-06-04 ENCOUNTER — Emergency Department
Admission: EM | Admit: 2023-06-04 | Discharge: 2023-06-04 | Disposition: A | Discharge status: Home / Self Care | Payer: Medicaid Other | Attending: Emergency Medicine | Admitting: Emergency Medicine

## 2023-06-04 DIAGNOSIS — X58XXXA Exposure to other specified factors, initial encounter: Secondary | ICD-10-CM | POA: Diagnosis not present

## 2023-06-04 DIAGNOSIS — S60454A Superficial foreign body of right ring finger, initial encounter: Secondary | ICD-10-CM | POA: Diagnosis present

## 2023-06-04 NOTE — ED Triage Notes (Signed)
Pt has ring stuck on right 4th finger for 1 day.  Pt requesting ring to be removed.  Good distal pulses and sensation noted   pt alert.

## 2023-06-04 NOTE — ED Provider Notes (Signed)
   [facility] Provider Note    Event Date/Time   First MD Initiated Contact with Patient 06/04/23 1534     (approximate)   History   ring removal   HPI  Briteny Fulghum is a 42 y.o. female with history of anxiety presents emergency department with a ring stuck on her right fourth finger x 24 hours.  Patient states that her daughter's drainage put it on her hand yesterday so she would lose it.  Did not take it off before bed and now she cannot get it off.      Physical Exam   Triage Vital Signs: ED Triage Vitals  Encounter Vitals Group     BP 06/04/23 1515 (!) 133/98     Systolic BP Percentile --      Diastolic BP Percentile --      Pulse Rate 06/04/23 1515 81     Resp 06/04/23 1515 18     Temp 06/04/23 1515 98 F (36.7 C)     Temp Source 06/04/23 1515 Oral     SpO2 06/04/23 1515 97 %     Weight 06/04/23 1513 155 lb (70.3 kg)     Height 06/04/23 1513 5\' 4"  (1.626 m)     Head Circumference --      Peak Flow --      Pain Score 06/04/23 1513 5     Pain Loc --      Pain Education --      Exclude from Growth Chart --     Most recent vital signs: Vitals:   06/04/23 1515  BP: (!) 133/98  Pulse: 81  Resp: 18  Temp: 98 F (36.7 C)  SpO2: 97%     General: Awake, no distress.   CV:  Good peripheral perfusion. regular rate and  rhythm Resp:  Normal effort. Abd:  No distention.   Other:  Right fourth finger with ring that is currently unable to be removed, neurovascular intact   ED Results / Procedures / Treatments   Labs (all labs ordered are listed, but only abnormal results are displayed) Labs Reviewed - No data to display   EKG     RADIOLOGY     PROCEDURES:   Procedures   MEDICATIONS ORDERED IN ED: Medications - No data to display   IMPRESSION / MDM / ASSESSMENT AND PLAN / ED COURSE  I reviewed the triage vital signs and the nursing notes.                              Differential diagnosis includes,  but is not limited to, ring removal, cellulitis, vascular constriction  Patient's presentation is most consistent with acute, uncomplicated illness.   Ring was cut off by Gustavo Lah, paramedic under my supervision, pt tolerated procedure well, discharged in stable condition      FINAL CLINICAL IMPRESSION(S) / ED DIAGNOSES   Final diagnoses:  Foreign body of right ring finger     Rx / DC Orders   ED Discharge Orders     None        Note:  This document was prepared using Dragon voice recognition software and may include unintentional dictation errors.    Faythe Ghee, PA-C 06/04/23 1552    Merwyn Katos, MD 06/05/23 2252

## 2023-06-08 NOTE — Telephone Encounter (Signed)
Spoke with patient to make sure she didn't need earlier appointment patient stated at this moment she is feeling well and has her medication she will call if she needs to come in earlier , stated she has medication and will keep appointment she has scheduled for the 23rd

## 2023-06-17 ENCOUNTER — Ambulatory Visit: Payer: Self-pay | Admitting: *Deleted

## 2023-06-17 ENCOUNTER — Ambulatory Visit: Payer: Medicaid Other | Admitting: Family Medicine

## 2023-06-17 NOTE — Telephone Encounter (Signed)
  Chief Complaint: requesting to reschedule appt and continues with elevated BP  Symptoms: has not check BP today over weekend BP 145/107. Denies sx reports she is feeling "fine' Frequency: over weekend  Pertinent Negatives: Patient denies sx Disposition: [] ED /[] Urgent Care (no appt availability in office) / [] Appointment(In office/virtual)/ []  [place] [facility]/ [] Home Care/ [] Refused Recommended Disposition /[]  Mobile Bus/ [x]  Follow-up with PCP   Additional Notes: recommended to recheck BP. If remains elevated call back. Patient canceled appt this am due to children sick and needed to take them to Dr appt. Reports she feels "fine" but wanted to reschedule appt. Rescheduled for 06/23/23 with Theotis Barrio, NP . Please advise .   Reason for Disposition  Systolic BP  >= 160 OR Diastolic >= 100  Answer Assessment - Initial Assessment Questions 1. BLOOD PRESSURE: "What is the blood pressure?" "Did you take at least two measurements 5 minutes apart?"     Couple of days ago BP 145/107 not able to check today  2. ONSET: "When did you take your blood pressure?"     See above 3. HOW: "How did you take your blood pressure?" (e.g., automatic home BP monitor, visiting nurse)     na 4. HISTORY: "Do you have a history of high blood pressure?"     Yes  5. MEDICINES: "Are you taking any medicines for blood pressure?" "Have you missed any doses recently?"     na 6. OTHER SYMPTOMS: "Do you have any symptoms?" (e.g., blurred vision, chest pain, difficulty breathing, headache, weakness)     Reports "I feel fine'  7. PREGNANCY: "Is there any chance you are pregnant?" "When was your last menstrual period?"     na  Protocols used: Blood Pressure - High-A-AH

## 2023-06-23 ENCOUNTER — Ambulatory Visit: Payer: Medicaid Other | Admitting: Family

## 2023-08-31 ENCOUNTER — Other Ambulatory Visit: Payer: Self-pay | Admitting: Family Medicine

## 2023-08-31 DIAGNOSIS — I10 Essential (primary) hypertension: Secondary | ICD-10-CM

## 2023-08-31 NOTE — Telephone Encounter (Signed)
 Medication Refill -  Most Recent Primary Care Visit:  Provider: LORREN GREIG PARAS  Department: [facility]  Visit Type: OFFICE VISIT  Date: 12/09/2022  Medication: losartan  (COZAAR ) 50 MG tablet [[id number]]   Has the patient contacted their pharmacy? Yes (Agent: If no, request that the patient contact the pharmacy for the refill. If patient does not wish to contact the pharmacy document the reason why and proceed with request.) (Agent: If yes, when and what did the pharmacy advise?)  Is this the correct pharmacy for this prescription? Yes If no, delete pharmacy and type the correct one.  This is the patient's preferred pharmacy:    [facility] [address], [place] - [address] [place] [address] Phone: [phone] Fax: [phone]   Has the prescription been filled recently? Yes  Is the patient out of the medication? Yes Pt ran out of medication on Saturday  Has the patient been seen for an appointment in the last year OR does the patient have an upcoming appointment? Yes 09/14/2023  Can we respond through MyChart? No  Agent: Please be advised that Rx refills may take up to 3 business days. We ask that you follow-up with your pharmacy.

## 2023-09-02 ENCOUNTER — Other Ambulatory Visit: Payer: Self-pay | Admitting: Family Medicine

## 2023-09-02 DIAGNOSIS — I10 Essential (primary) hypertension: Secondary | ICD-10-CM

## 2023-09-02 MED ORDER — LOSARTAN POTASSIUM 50 MG PO TABS: 50.0000 mg | ORAL_TABLET | Freq: Every day | ORAL | 0 refills | Status: DC

## 2023-09-02 NOTE — Telephone Encounter (Signed)
 Pt. Has appointment. Requested Prescriptions  Pending Prescriptions Disp Refills   losartan  (COZAAR ) 50 MG tablet 30 tablet 0    Sig: Take 1 tablet (50 mg total) by mouth daily.     Cardiovascular:  Angiotensin Receptor Blockers Failed - 09/02/2023  3:35 PM      Failed - Cr in normal range and within 180 days    Creatinine, Ser  Date Value Ref Range Status  12/09/2022 0.56 (L) 0.57 - 1.00 mg/dL Final   Creatinine, Urine  Date Value Ref Range Status  08/15/2020 58.22 mg/dL Final         Failed - K in normal range and within 180 days    Potassium  Date Value Ref Range Status  12/09/2022 4.2 3.5 - 5.2 mmol/L Final         Failed - Last BP in normal range    BP Readings from Last 1 Encounters:  06/04/23 (!) 133/98         Failed - Valid encounter within last 6 months    Recent Outpatient Visits           8 months ago Primary hypertension   [facility] [place], [place]   1 year ago Essential hypertension   [facility], MD   4 years ago Insomnia, unspecified type   [facility], MD   4 years ago Generalized anxiety disorder   [facility], [place], MD   4 years ago Needs flu shot   [facility], MD       Future Appointments             In 1 week Lorren Greig PARAS, NP [facility] - Patient is not pregnant

## 2023-09-14 ENCOUNTER — Ambulatory Visit (INDEPENDENT_AMBULATORY_CARE_PROVIDER_SITE_OTHER): Payer: Medicaid Other | Admitting: Family

## 2023-09-14 ENCOUNTER — Encounter: Payer: Self-pay | Admitting: Family

## 2023-09-14 VITALS — BP 124/86 | HR 87 | Temp 97.8°F | Ht 64.0 in | Wt 168.4 lb

## 2023-09-14 DIAGNOSIS — I10 Essential (primary) hypertension: Secondary | ICD-10-CM | POA: Diagnosis not present

## 2023-09-14 MED ORDER — LOSARTAN POTASSIUM 50 MG PO TABS: 50.0000 mg | ORAL_TABLET | Freq: Every day | ORAL | 0 refills | Status: DC

## 2023-09-14 NOTE — Progress Notes (Signed)
 Patient states no other concerns to discuss.   Wants Flu vaccine.

## 2023-09-14 NOTE — Progress Notes (Signed)
Patient ID: Courtney Torres, female    DOB: [birth year]  MRN: [id number]  CC: Blood Pressure Check   Subjective: Courtney Torres is a 43 y.o. female who presents for blood pressure check.   Her concerns today include:  - Doing well on Losartan, no issues/concerns. Home blood pressures above goal. She does not complain of red flag symptoms such as but not limited to chest pain, shortness of breath, worst headache of life, nausea/vomiting.    Patient Active Problem List   Diagnosis Date Noted   Visit for routine gyn exam 10/16/2021   Vaginal discharge 10/16/2021   Hypertension 10/16/2021   History of cesarean section 02/17/2020   Generalized anxiety disorder 06/09/2019     Current Outpatient Medications on File Prior to Visit  Medication Sig Dispense Refill   ALPRAZolam (XANAX) 0.5 MG tablet Take 0.5 mg by mouth daily as needed.     QUEtiapine (SEROQUEL) 100 MG tablet Take 100 mg by mouth at bedtime.     venlafaxine XR (EFFEXOR-XR) 150 MG 24 hr capsule Take 150 mg by mouth daily.     LORazepam (ATIVAN) 0.5 MG tablet Take 0.5 mg by mouth daily as needed. (Patient not taking: Reported on 09/14/2023)     metroNIDAZOLE (FLAGYL) 500 MG tablet Take 1 tablet (500 mg total) by mouth 2 (two) times daily. (Patient not taking: Reported on 09/14/2023) 14 tablet 0   venlafaxine XR (EFFEXOR-XR) 75 MG 24 hr capsule Take by mouth. (Patient not taking: Reported on 09/14/2023)     No current facility-administered medications on file prior to visit.    No Known Allergies  Social History   Socioeconomic History   Marital status: Married    Spouse name: Synclaire Koff   Number of children: 2   Years of education: Not on file   Highest education level: Not on file  Occupational History   Occupation: unemployed  Tobacco Use   Smoking status: Former    Current packs/day: 0.50    Types: Cigarettes   Smokeless tobacco: Never  Vaping Use   Vaping status: Some Days  Substance and Sexual  Activity   Alcohol use: Not Currently    Comment: occ   Drug use: No   Sexual activity: Yes    Birth control/protection: Surgical  Other Topics Concern   Not on file  Social History Narrative   Not on file   Social Drivers of Health   Financial Resource Strain: Low Risk  (09/14/2023)   Overall Financial Resource Strain (CARDIA)    Difficulty of Paying Living Expenses: Not hard at all  Food Insecurity: No Food Insecurity (09/14/2023)   Hunger Vital Sign    Worried About Running Out of Food in the Last Year: Never true    Ran Out of Food in the Last Year: Never true  Transportation Needs: No Transportation Needs (09/14/2023)   PRAPARE - Administrator, Civil Service (Medical): No    Lack of Transportation (Non-Medical): No  Physical Activity: Sufficiently Active (09/14/2023)   Exercise Vital Sign    Days of Exercise per Week: 4 days    Minutes of Exercise per Session: 60 min  Stress: Stress Concern Present (09/14/2023)   [employer] of Occupational Health - Occupational Stress Questionnaire    Feeling of Stress : Rather much  Social Connections: Moderately Integrated (09/14/2023)   Social Connection and Isolation Panel [NHANES]    Frequency of Communication with Friends and Family: More than three times a week  Frequency of Social Gatherings with Friends and Family: Patient unable to answer    Attends Religious Services: More than 4 times per year    Active Member of Clubs or Organizations: No    Attends Banker Meetings: Never    Marital Status: Married  Catering manager Violence: Not At Risk (09/14/2023)   Humiliation, Afraid, Rape, and Kick questionnaire    Fear of Current or Ex-Partner: No    Emotionally Abused: No    Physically Abused: No    Sexually Abused: No    Family History  Problem Relation Age of Onset   Hypertension Father    Arthritis Father    Asthma Father    COPD Father    Heart failure Sister    Early death Sister     Heart disease Sister    Hypertension Brother    Alcohol abuse Brother    Drug abuse Brother    Hypertension Paternal Grandfather    Anxiety disorder Mother    Arthritis Mother    Depression Mother     Past Surgical History:  Procedure Laterality Date   CERVICAL BIOPSY  W/ LOOP ELECTRODE EXCISION     CESAREAN SECTION     COLPOSCOPY     TONSILLECTOMY AND ADENOIDECTOMY     TUBAL LIGATION N/A 08/16/2020   Procedure: POST PARTUM TUBAL LIGATION;  Surgeon: Hermina Staggers, MD;  Location: [facility];  Service: Gynecology;  Laterality: N/A;    ROS: Review of Systems Negative except as stated above  PHYSICAL EXAM: BP 124/86   Pulse 87   Temp 97.8 F (36.6 C) (Oral)   Ht 5\' 4"  (1.626 m)   Wt 168 lb 6.4 oz (76.4 kg)   LMP  (LMP Unknown)   SpO2 98%   BMI 28.91 kg/m   Physical Exam HENT:     Head: Normocephalic and atraumatic.     Nose: Nose normal.     Mouth/Throat:     Mouth: Mucous membranes are moist.     Pharynx: Oropharynx is clear.  Eyes:     Extraocular Movements: Extraocular movements intact.     Conjunctiva/sclera: Conjunctivae normal.     Pupils: Pupils are equal, round, and reactive to light.  Cardiovascular:     Rate and Rhythm: Normal rate and regular rhythm.     Pulses: Normal pulses.     Heart sounds: Normal heart sounds.  Pulmonary:     Effort: Pulmonary effort is normal.     Breath sounds: Normal breath sounds.  Musculoskeletal:        General: Normal range of motion.     Cervical back: Normal range of motion and neck supple.  Neurological:     General: No focal deficit present.     Mental Status: She is alert and oriented to person, place, and time.  Psychiatric:        Mood and Affect: Mood normal.        Behavior: Behavior normal.     ASSESSMENT AND PLAN: 1. Primary hypertension (Primary) - Continue Losartan as prescribed.  - Routine screening.  - Counseled on blood pressure goal of less than 130/80, low-sodium, DASH diet, medication  compliance, and 150 minutes of moderate intensity exercise per week as tolerated. Counseled on medication adherence and adverse effects. - Follow-up with primary provider in 4 weeks or sooner if needed. Bring home blood pressure monitor to next office visit.  - losartan (COZAAR) 50 MG tablet; Take 1 tablet (50 mg total)  by mouth daily.  Dispense: 90 tablet; Refill: 0 - Basic Metabolic Panel   Patient was given the opportunity to ask questions.  Patient verbalized understanding of the plan and was able to repeat key elements of the plan. Patient was given clear instructions to go to Emergency Department or return to medical center if symptoms don't improve, worsen, or new problems develop.The patient verbalized understanding.   Orders Placed This Encounter  Procedures   Basic Metabolic Panel     Requested Prescriptions   Signed Prescriptions Disp Refills   losartan (COZAAR) 50 MG tablet 90 tablet 0    Sig: Take 1 tablet (50 mg total) by mouth daily.    Return in about 4 weeks (around 10/12/2023) for Follow-Up or next available chronic conditions with Georganna Skeans, MD .  Rema Fendt, NP

## 2023-09-15 LAB — BASIC METABOLIC PANEL
BUN/Creatinine Ratio: 27 — ABNORMAL HIGH (ref 9–23)
BUN: 18 mg/dL (ref 6–24)
CO2: 23 mmol/L (ref 20–29)
Calcium: 9.4 mg/dL (ref 8.7–10.2)
Chloride: 100 mmol/L (ref 96–106)
Creatinine, Ser: 0.67 mg/dL (ref 0.57–1.00)
Glucose: 80 mg/dL (ref 70–99)
Potassium: 4.1 mmol/L (ref 3.5–5.2)
Sodium: 136 mmol/L (ref 134–144)
eGFR: 113 mL/min/{1.73_m2} (ref >59)

## 2023-10-26 ENCOUNTER — Ambulatory Visit (INDEPENDENT_AMBULATORY_CARE_PROVIDER_SITE_OTHER): Payer: Medicaid Other | Admitting: Family Medicine

## 2023-10-26 ENCOUNTER — Encounter: Payer: Self-pay | Admitting: Family Medicine

## 2023-10-26 ENCOUNTER — Ambulatory Visit: Attending: Family Medicine

## 2023-10-26 VITALS — BP 143/95 | HR 81 | Temp 97.5°F | Resp 16 | Ht 64.0 in | Wt 173.6 lb

## 2023-10-26 DIAGNOSIS — R002 Palpitations: Secondary | ICD-10-CM

## 2023-10-26 DIAGNOSIS — I10 Essential (primary) hypertension: Secondary | ICD-10-CM

## 2023-10-26 MED ORDER — LOSARTAN POTASSIUM 100 MG PO TABS: 100.0000 mg | ORAL_TABLET | Freq: Every day | ORAL | 0 refills | Status: DC

## 2023-10-26 NOTE — Progress Notes (Unsigned)
 EP to read.

## 2023-10-26 NOTE — Progress Notes (Signed)
 Established Patient Office Visit  Subjective    Patient ID: Courtney Torres, female    DOB: [birth year]  Age: 43 y.o. MRN: [id number]  CC:  Chief Complaint  Patient presents with   Follow-up    4 week    HPI Navjot Pilgrim presents for routine follow up of hypertension. Patient also reports that she has been having intermittent palpitations and is anxious because she has heart disease in her family.   Outpatient Encounter Medications as of 10/26/2023  Medication Sig   ALPRAZolam (XANAX) 0.5 MG tablet Take 0.5 mg by mouth daily as needed.   losartan (COZAAR) 100 MG tablet Take 1 tablet (100 mg total) by mouth daily.   losartan (COZAAR) 50 MG tablet Take 1 tablet (50 mg total) by mouth daily.   QUEtiapine (SEROQUEL) 100 MG tablet Take 100 mg by mouth at bedtime.   venlafaxine XR (EFFEXOR-XR) 150 MG 24 hr capsule Take 150 mg by mouth daily.   LORazepam (ATIVAN) 0.5 MG tablet Take 0.5 mg by mouth daily as needed. (Patient not taking: Reported on 09/14/2023)   metroNIDAZOLE (FLAGYL) 500 MG tablet Take 1 tablet (500 mg total) by mouth 2 (two) times daily. (Patient not taking: Reported on 09/14/2023)   venlafaxine XR (EFFEXOR-XR) 75 MG 24 hr capsule Take by mouth. (Patient not taking: Reported on 09/14/2023)   No facility-administered encounter medications on file as of 10/26/2023.    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   Depression    Pregnancy induced hypertension    Preterm labor     Past Surgical History:  Procedure Laterality Date   CERVICAL BIOPSY  W/ LOOP ELECTRODE EXCISION     CESAREAN SECTION     COLPOSCOPY     TONSILLECTOMY AND ADENOIDECTOMY     TUBAL LIGATION N/A 08/16/2020   Procedure: POST PARTUM TUBAL LIGATION;  Surgeon: Hermina Staggers, MD;  Location: [facility];  Service: Gynecology;  Laterality: N/A;    Family History  Problem Relation Age of Onset   Hypertension Father     Arthritis Father    Asthma Father    COPD Father    Heart failure Sister    Early death Sister    Heart disease Sister    Hypertension Brother    Alcohol abuse Brother    Drug abuse Brother    Hypertension Paternal Grandfather    Anxiety disorder Mother    Arthritis Mother    Depression Mother     Social History   Socioeconomic History   Marital status: Married    Spouse name: Sonnia Strong   Number of children: 2   Years of education: Not on file   Highest education level: Not on file  Occupational History   Occupation: unemployed  Tobacco Use   Smoking status: Former    Current packs/day: 0.50    Types: Cigarettes   Smokeless tobacco: Never  Vaping Use   Vaping status: Some Days  Substance and Sexual Activity   Alcohol use: Not Currently    Comment: occ   Drug use: No   Sexual activity: Yes    Birth control/protection: Surgical  Other Topics Concern   Not on file  Social History Narrative   Not on file   Social Drivers of Health   Financial Resource Strain: Low Risk  (09/14/2023)   Overall Financial Resource Strain (CARDIA)  Difficulty of Paying Living Expenses: Not hard at all  Food Insecurity: No Food Insecurity (09/14/2023)   Hunger Vital Sign    Worried About Running Out of Food in the Last Year: Never true    Ran Out of Food in the Last Year: Never true  Transportation Needs: No Transportation Needs (09/14/2023)   PRAPARE - Administrator, Civil Service (Medical): No    Lack of Transportation (Non-Medical): No  Physical Activity: Sufficiently Active (09/14/2023)   Exercise Vital Sign    Days of Exercise per Week: 4 days    Minutes of Exercise per Session: 60 min  Stress: Stress Concern Present (09/14/2023)   [employer] of Occupational Health - Occupational Stress Questionnaire    Feeling of Stress : Rather much  Social Connections: Moderately Integrated (09/14/2023)   Social Connection and Isolation Panel [NHANES]    Frequency  of Communication with Friends and Family: More than three times a week    Frequency of Social Gatherings with Friends and Family: Patient unable to answer    Attends Religious Services: More than 4 times per year    Active Member of [employer] or Organizations: No    Attends Banker Meetings: Never    Marital Status: Married  Catering manager Violence: Not At Risk (09/14/2023)   Humiliation, Afraid, Rape, and Kick questionnaire    Fear of Current or Ex-Partner: No    Emotionally Abused: No    Physically Abused: No    Sexually Abused: No    Review of Systems  Respiratory:  Negative for shortness of breath.   Cardiovascular:  Positive for palpitations. Negative for chest pain.  All other systems reviewed and are negative.       Objective    BP (!) 143/95   Pulse 81   Temp (!) 97.5 F (36.4 C) (Oral)   Resp 16   Ht 5\' 4"  (1.626 m)   Wt 173 lb 9.6 oz (78.7 kg)   SpO2 97%   BMI 29.80 kg/m   Physical Exam Vitals and nursing note reviewed.  Constitutional:      General: She is not in acute distress. Cardiovascular:     Rate and Rhythm: Normal rate and regular rhythm.  Pulmonary:     Effort: Pulmonary effort is normal.     Breath sounds: Normal breath sounds.  Abdominal:     Palpations: Abdomen is soft.     Tenderness: There is no abdominal tenderness.  Musculoskeletal:     Right lower leg: No edema.     Left lower leg: No edema.  Neurological:     General: No focal deficit present.     Mental Status: She is alert and oriented to person, place, and time.         Assessment & Plan:   Primary hypertension  Palpitations -     LONG TERM MONITOR (3-14 DAYS); Future  Other orders -     Losartan Potassium; Take 1 tablet (100 mg total) by mouth daily.  Dispense: 90 tablet; Refill: 0   Slightly increased BP reading. Will increase losartan from 50 mg to 100mg  daily  Return in about 6 weeks (around 12/07/2023) for follow up.   Tommie Raymond, MD

## 2023-12-08 ENCOUNTER — Ambulatory Visit: Admitting: Family Medicine

## 2023-12-17 DIAGNOSIS — R002 Palpitations: Secondary | ICD-10-CM

## 2024-01-11 ENCOUNTER — Ambulatory Visit: Admitting: Family Medicine

## 2024-01-24 ENCOUNTER — Other Ambulatory Visit: Payer: Self-pay | Admitting: Family Medicine

## 2024-04-04 ENCOUNTER — Ambulatory Visit: Payer: Self-pay | Admitting: Family Medicine

## 2024-04-06 ENCOUNTER — Other Ambulatory Visit: Payer: Self-pay | Admitting: Family Medicine

## 2024-04-06 NOTE — Telephone Encounter (Unsigned)
 Copied from CRM #[id number]. Topic: Clinical - Medication Refill >> Apr 06, 2024 11:33 AM Emylou G wrote: Medication: losartan  (COZAAR ) 100 MG tablet  Has the patient contacted their pharmacy? No (Agent: If no, request that the patient contact the pharmacy for the refill. If patient does not wish to contact the pharmacy document the reason why and proceed with request.) (Agent: If yes, when and what did the pharmacy advise?)  This is the patient's preferred pharmacy:  [id number] - [place], [place] - [address] [place] [address] Phone: [phone] Fax: [phone]  Is this the correct pharmacy for this prescription? Yes If no, delete pharmacy and type the correct one.   Has the prescription been filled recently? no  Is the patient out of the medication? Yes  Has the patient been seen for an appointment in the last year OR does the patient have an upcoming appointment? Yes  Can we respond through MyChart? No  Agent: Please be advised that Rx refills may take up to 3 business days. We ask that you follow-up with your pharmacy.

## 2024-04-08 NOTE — Telephone Encounter (Signed)
 Requested medication (s) are due for refill today: yes  Requested medication (s) are on the active medication list: yes  Last refill:  01/25/24 #30  Future visit scheduled: no  Notes to clinic:  called pt to make appt. Pt stated will call back after she checks her schedule.   Requested Prescriptions  Pending Prescriptions Disp Refills   losartan  (COZAAR ) 100 MG tablet 30 tablet 0    Sig: Take 1 tablet (100 mg total) by mouth daily.     Cardiovascular:  Angiotensin Receptor Blockers Failed - 04/08/2024  2:59 PM      Failed - Cr in normal range and within 180 days    Creatinine, Ser  Date Value Ref Range Status  09/14/2023 0.67 0.57 - 1.00 mg/dL Final   Creatinine, Urine  Date Value Ref Range Status  08/15/2020 58.22 mg/dL Final         Failed - K in normal range and within 180 days    Potassium  Date Value Ref Range Status  09/14/2023 4.1 3.5 - 5.2 mmol/L Final         Failed - Last BP in normal range    BP Readings from Last 1 Encounters:  10/26/23 (!) 143/95         Passed - Patient is not pregnant      Passed - Valid encounter within last 6 months    Recent Outpatient Visits           5 months ago Primary hypertension   [facility], MD   6 months ago Primary hypertension   [facility] [facility], [place], [place]   1 year ago Primary hypertension   [place], [place], [place]   1 year ago Essential hypertension   [facility], MD   4 years ago Insomnia, unspecified type   [facility], MD

## 2024-04-11 MED ORDER — LOSARTAN POTASSIUM 100 MG PO TABS: 100.0000 mg | ORAL_TABLET | Freq: Every day | ORAL | 0 refills | Status: DC

## 2024-04-11 NOTE — Telephone Encounter (Signed)
 Copied from CRM #[id number]. Topic: Clinical - Medication Question >> Apr 11, 2024 11:07 AM Cleave MATSU wrote: Reason for CRM: pt needs losartan  sent in to pharmacy I see where it is pending and not approved yet

## 2024-05-03 ENCOUNTER — Other Ambulatory Visit: Payer: Self-pay | Admitting: Family Medicine

## 2024-05-06 NOTE — Telephone Encounter (Signed)
 Pt scheduled

## 2024-05-25 ENCOUNTER — Ambulatory Visit
Admission: EM | Admit: 2024-05-25 | Discharge: 2024-05-25 | Disposition: A | Discharge status: Home / Self Care | Attending: Nurse Practitioner | Admitting: Nurse Practitioner

## 2024-05-25 ENCOUNTER — Other Ambulatory Visit: Payer: Self-pay

## 2024-05-25 ENCOUNTER — Encounter: Payer: Self-pay | Admitting: *Deleted

## 2024-05-25 ENCOUNTER — Ambulatory Visit: Payer: Self-pay

## 2024-05-25 ENCOUNTER — Ambulatory Visit: Payer: Self-pay | Admitting: Nurse Practitioner

## 2024-05-25 DIAGNOSIS — R8281 Pyuria: Secondary | ICD-10-CM | POA: Insufficient documentation

## 2024-05-25 DIAGNOSIS — M545 Low back pain, unspecified: Secondary | ICD-10-CM | POA: Diagnosis present

## 2024-05-25 DIAGNOSIS — M549 Dorsalgia, unspecified: Secondary | ICD-10-CM | POA: Insufficient documentation

## 2024-05-25 LAB — POCT URINE DIPSTICK
Bilirubin, UA: NEGATIVE
Blood, UA: NEGATIVE
Glucose, UA: NEGATIVE mg/dL
Ketones, POC UA: NEGATIVE mg/dL
Nitrite, UA: NEGATIVE
POC PROTEIN,UA: NEGATIVE
Spec Grav, UA: 1.01 (ref 1.010–1.025)
Urobilinogen, UA: 0.2 U/dL
pH, UA: 6.5 (ref 5.0–8.0)

## 2024-05-25 LAB — POCT URINE PREGNANCY: Preg Test, Ur: NEGATIVE

## 2024-05-25 MED ORDER — KETOROLAC TROMETHAMINE 30 MG/ML IJ SOLN
30.0000 mg | Freq: Once | INTRAMUSCULAR | Status: AC
  Administered 2024-05-25: 30 mg via INTRAMUSCULAR

## 2024-05-25 MED ORDER — METHOCARBAMOL 500 MG PO TABS: 500.0000 mg | ORAL_TABLET | Freq: Every morning | ORAL | 0 refills | Status: AC

## 2024-05-25 MED ORDER — NAPROXEN 500 MG PO TABS: 500.0000 mg | ORAL_TABLET | Freq: Two times a day (BID) | ORAL | 0 refills | Status: AC

## 2024-05-25 MED ORDER — KETOROLAC TROMETHAMINE 60 MG/2ML IM SOLN: 60.0000 mg | Freq: Once | INTRAMUSCULAR | Status: DC

## 2024-05-25 MED ORDER — DEXAMETHASONE SODIUM PHOSPHATE 10 MG/ML IJ SOLN
10.0000 mg | Freq: Once | INTRAMUSCULAR | Status: AC
  Administered 2024-05-25: 10 mg via INTRAMUSCULAR

## 2024-05-25 MED ORDER — CYCLOBENZAPRINE HCL 10 MG PO TABS: 10.0000 mg | ORAL_TABLET | Freq: Every day | ORAL | 0 refills | Status: AC

## 2024-05-25 NOTE — Discharge Instructions (Addendum)
 You were seen today for pain in your lower back, as well as discomfort above your left shoulder blade and in your right upper back. There was no injury that caused it, and your exam today did not show any serious problems. Your urine test was not suggestive of a urinary tract infection, but because it showed a small number of white blood cells, a culture was sent to the lab. If the culture shows an infection, you will be notified and treatment will be started. If the results are normal, you can review them through your MyChart account. Your pain appears to be muscle-related. You have been prescribed naproxen to take twice a day. Do not take ibuprofen , Advil , Motrin , or aspirin  while on this medicine. You may take Tylenol  in addition to naproxen if you need more pain relief. Robaxin was prescribed for the morning and Flexeril for the evening to help relax your muscles. Using ice and heat on the sore areas may also give you relief. Please keep a close watch on your symptoms. If the pain does not improve, schedule a follow-up visit with an orthopedic specialist. Go to the emergency room right away if you develop severe or worsening pain, weakness, numbness, loss of bladder or bowel control, fever, or any other new or concerning symptoms.

## 2024-05-25 NOTE — ED Provider Notes (Signed)
 [name]    CSN: [id number] Arrival date & time: 05/25/24  9083      History   Chief Complaint Chief Complaint  Patient presents with   Flank Pain    HPI Courtney Torres is a 43 y.o. female.   Discussed the use of AI scribe software for clinical note transcription with the patient, who gave verbal consent to proceed.   Patient presents with lower back pain that started 4 days ago while driving. The pain initially began on the lower left side of the back and has gradually worsened over time. The patient has a history of nephrolithiasis. The pain has since spread, now encompassing the whole bottom part of the lower back. Additionally, the patient reports pain above the left shoulder blade and right thoracic region. The pain is constant and severe enough that the patient had difficulty standing still while at the grocery store last night. OTC pain medication did not provide relief. The patient has been on her feet for most of the past two days, which may have exacerbated the condition. Attempts to alleviate the pain include using a heating pad and taking a hot bath, which provided minimal temporary relief.  Associated symptoms include nausea (without vomiting), headaches, and elevated blood pressure, which the patient attributes to the pain. The patient denies any recent heavy lifting, pulling, or injury that could have triggered the pain. She also denies any urinary symptoms such as pain, burning, or increased frequency, and reports no blood in her urine. No bowel or bladder incontinence. There is no numbness or tingling in the groin, glutes, or legs, and no weakness reported.  The following sections of the patient's history were reviewed and updated as appropriate: allergies, current medications, past family history, past medical history, past social history, past surgical history, and problem list.     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   Depression    Pregnancy induced hypertension    Preterm labor     Patient Active Problem List   Diagnosis Date Noted   Visit for routine gyn exam 10/16/2021   Vaginal discharge 10/16/2021   Hypertension 10/16/2021   History of cesarean section 02/17/2020   Generalized anxiety disorder 06/09/2019    Past Surgical History:  Procedure Laterality Date   CERVICAL BIOPSY  W/ LOOP ELECTRODE EXCISION     CESAREAN SECTION     COLPOSCOPY     TONSILLECTOMY AND ADENOIDECTOMY     TUBAL LIGATION N/A 08/16/2020   Procedure: POST PARTUM TUBAL LIGATION;  Surgeon: Lorence Ozell CROME, MD;  Location: [facility];  Service: Gynecology;  Laterality: N/A;    OB History     Gravida  3   Para  3   Term      Preterm  3   AB      Living  3      SAB      IAB      Ectopic      Multiple  0   Live Births  3        Obstetric Comments  Pre-eclampsia with 1st, induced and then CS bc baby was breech          Home Medications    Prior to Admission medications   Medication Sig Start Date End Date Taking? Authorizing Provider  ALPRAZolam (XANAX) 0.5 MG tablet Take 0.5 mg by  mouth daily as needed. 05/08/22  Yes [provider]  cyclobenzaprine (FLEXERIL) 10 MG tablet Take 1 tablet (10 mg total) by mouth at bedtime. 05/25/24  Yes Bresha Hosack, Lucie, FNP  losartan  (COZAAR ) 100 MG tablet TAKE 1 TABLET BY MOUTH EVERY DAY 05/06/24  Yes Tanda Bleacher, MD  methocarbamol (ROBAXIN) 500 MG tablet Take 1 tablet (500 mg total) by mouth every morning. 05/25/24  Yes Iola Lucie, FNP  naproxen (NAPROSYN) 500 MG tablet Take 1 tablet (500 mg total) by mouth 2 (two) times daily with a meal. Take with food to avoid stomach upset. Do not take any additional NSAIDs while on this. You may take tylenol  in addition to this if needed for extra pain relief. 05/25/24  Yes Iola Lucie, FNP  QUEtiapine (SEROQUEL) 100 MG tablet Take 100 mg by  mouth at bedtime. 04/10/22  Yes [provider]  venlafaxine XR (EFFEXOR-XR) 150 MG 24 hr capsule Take 150 mg by mouth daily. 10/14/21  Yes [provider]  venlafaxine XR (EFFEXOR-XR) 75 MG 24 hr capsule Take by mouth. 04/10/22  Yes [provider]  LORazepam (ATIVAN) 0.5 MG tablet Take 0.5 mg by mouth daily as needed. Patient not taking: Reported on 09/14/2023 09/25/21   [provider]  losartan  (COZAAR ) 50 MG tablet Take 1 tablet (50 mg total) by mouth daily. Patient not taking: Reported on 05/25/2024 09/14/23   Lorren Greig PARAS, NP  metroNIDAZOLE  (FLAGYL ) 500 MG tablet Take 1 tablet (500 mg total) by mouth 2 (two) times daily. Patient not taking: Reported on 09/14/2023 10/21/21   Ervin, Michael L, MD    Family History Family History  Problem Relation Age of Onset   Hypertension Father    Arthritis Father    Asthma Father    COPD Father    Heart failure Sister    Early death Sister    Heart disease Sister    Hypertension Brother    Alcohol abuse Brother    Drug abuse Brother    Hypertension Paternal Grandfather    Anxiety disorder Mother    Arthritis Mother    Depression Mother     Social History Social History   Tobacco Use   Smoking status: Former    Current packs/day: 0.50    Types: Cigarettes   Smokeless tobacco: Never  Vaping Use   Vaping status: Some Days  Substance Use Topics   Alcohol use: Not Currently    Comment: occ   Drug use: No     Allergies   Patient has no known allergies.   Review of Systems Review of Systems  Gastrointestinal:  Positive for nausea. Negative for vomiting.       No bowel incontinence   Genitourinary:  Negative for dysuria, frequency and hematuria.       No bladder incontinence   Musculoskeletal:  Positive for back pain (left lower).  Neurological:  Positive for headaches. Negative for weakness and numbness.  All other systems reviewed and are negative.    Physical Exam Triage Vital Signs ED  Triage Vitals  Encounter Vitals Group     BP 05/25/24 1016 (!) 156/111     Girls Systolic BP Percentile --      Girls Diastolic BP Percentile --      Boys Systolic BP Percentile --      Boys Diastolic BP Percentile --      Pulse Rate 05/25/24 1016 77     Resp 05/25/24 1016 16     Temp 05/25/24 1016  99.1 F (37.3 C)     Temp Source 05/25/24 1016 Oral     SpO2 05/25/24 1016 98 %     Weight --      Height --      Head Circumference --      Peak Flow --      Pain Score 05/25/24 1013 7     Pain Loc --      Pain Education --      Exclude from Growth Chart --    No data found.  Updated Vital Signs BP (!) 156/111 (BP Location: Left Arm)   Pulse 77   Temp 99.1 F (37.3 C) (Oral)   Resp 16   LMP 05/18/2024 (Approximate)   SpO2 98%   Visual Acuity Right Eye Distance:   Left Eye Distance:   Bilateral Distance:    Right Eye Near:   Left Eye Near:    Bilateral Near:     Physical Exam Vitals reviewed.  Constitutional:      General: She is not in acute distress.    Appearance: Normal appearance. She is not ill-appearing, toxic-appearing or diaphoretic.  HENT:     Head: Normocephalic.     Mouth/Throat:     Mouth: Mucous membranes are moist.  Cardiovascular:     Rate and Rhythm: Normal rate and regular rhythm.  Pulmonary:     Effort: Pulmonary effort is normal.     Breath sounds: Normal breath sounds.  Abdominal:     Palpations: Abdomen is soft.     Tenderness: There is no right CVA tenderness or left CVA tenderness.  Musculoskeletal:        General: Normal range of motion.     Cervical back: Normal, normal range of motion and neck supple. No tenderness.     Thoracic back: Tenderness present. Normal range of motion.     Lumbar back: Tenderness present. No swelling, deformity, lacerations or spasms. Normal range of motion. Negative right straight leg raise test and negative left straight leg raise test.       Back:     Comments: Pain across the lumbar spine as well as  right thoracic region and left posterior shoulder. No vertebreal tenderness noted. Full strength, sensation and ROM of spine and exptremities. NVI.   Skin:    General: Skin is warm and dry.  Neurological:     General: No focal deficit present.     Mental Status: She is alert and oriented to person, place, and time.     Cranial Nerves: Cranial nerves 2-12 are intact.     Sensory: Sensation is intact.     Motor: Motor function is intact. No weakness.     Coordination: Coordination is intact.     Gait: Gait is intact.  Psychiatric:        Mood and Affect: Mood normal.        Speech: Speech normal.        Behavior: Behavior normal. Behavior is cooperative.      UC Treatments / Results  Labs (all labs ordered are listed, but only abnormal results are displayed) Labs Reviewed  POCT URINE DIPSTICK - Abnormal; Notable for the following components:      Result Value   Color, UA light yellow (*)    Clarity, UA cloudy (*)    Leukocytes, UA Trace (*)    All other components within normal limits  URINE CULTURE  POCT URINE PREGNANCY    EKG   Radiology No  results found.  Procedures Procedures (including critical care time)  Medications Ordered in UC Medications - No data to display  Initial Impression / Assessment and Plan / UC Course  I have reviewed the triage vital signs and the nursing notes.  Pertinent labs & imaging results that were available during my care of the patient were reviewed by me and considered in my medical decision making (see chart for details).     The patient presents with low back pain that began four days ago while driving and has progressively worsened. She also reports pain above the left shoulder blade and in the right thoracic region. No urinary symptoms reported.  There has been no specific injury or activity to trigger the pain, and no red flag symptoms are present. Physical exam is unremarkable. UA not suggestive of UTI but given trace amount of  leukocytes, will send for culture. Treatment will be based on results of culture. Her presentation is most consistent with musculoskeletal pain. She was prescribed naproxen twice daily with instructions to avoid other NSAIDs or aspirin  while on this medication, and may take Tylenol  as needed for additional pain relief. Robaxin was prescribed for use in the morning and Flexeril at night for muscle relaxation. Supportive measures including alternating ice and heat were recommended. She will be notified if results are positive and treatment needed, otherwise she can review results on her MyChart. She was advised to monitor symptoms closely, follow up with orthopedics if symptoms fail to improve, and seek emergency care if red flag symptoms develop.  Today's evaluation has revealed no signs of a dangerous process. Discussed diagnosis with patient and/or guardian. Patient and/or guardian aware of their diagnosis, possible red flag symptoms to watch out for and need for close follow up. Patient and/or guardian understands verbal and written discharge instructions. Patient and/or guardian comfortable with plan and disposition.  Patient and/or guardian has a clear mental status at this time, good insight into illness (after discussion and teaching) and has clear judgment to make decisions regarding their care  Documentation was completed with the aid of voice recognition software. Transcription may contain typographical errors.  Final Clinical Impressions(s) / UC Diagnoses   Final diagnoses:  Acute bilateral low back pain without sciatica  Musculoskeletal back pain  Pyuria     Discharge Instructions      You were seen today for pain in your lower back, as well as discomfort above your left shoulder blade and in your right upper back. There was no injury that caused it, and your exam today did not show any serious problems. Your urine test was not suggestive of a urinary tract infection, but because it  showed a small number of white blood cells, a culture was sent to the lab. If the culture shows an infection, you will be notified and treatment will be started. If the results are normal, you can review them through your MyChart account. Your pain appears to be muscle-related. You have been prescribed naproxen to take twice a day. Do not take ibuprofen , Advil , Motrin , or aspirin  while on this medicine. You may take Tylenol  in addition to naproxen if you need more pain relief. Robaxin was prescribed for the morning and Flexeril for the evening to help relax your muscles. Using ice and heat on the sore areas may also give you relief. Please keep a close watch on your symptoms. If the pain does not improve, schedule a follow-up visit with an orthopedic specialist. Go to the emergency room  right away if you develop severe or worsening pain, weakness, numbness, loss of bladder or bowel control, fever, or any other new or concerning symptoms.     ED Prescriptions     Medication Sig Dispense Auth. Provider   naproxen (NAPROSYN) 500 MG tablet Take 1 tablet (500 mg total) by mouth 2 (two) times daily with a meal. Take with food to avoid stomach upset. Do not take any additional NSAIDs while on this. You may take tylenol  in addition to this if needed for extra pain relief. 20 tablet Iola Lukes, FNP   cyclobenzaprine (FLEXERIL) 10 MG tablet Take 1 tablet (10 mg total) by mouth at bedtime. 10 tablet Iola Lukes, FNP   methocarbamol (ROBAXIN) 500 MG tablet Take 1 tablet (500 mg total) by mouth every morning. 10 tablet Iola Lukes, FNP      PDMP not reviewed this encounter.   Iola Lukes, FNP 05/25/24 1226

## 2024-05-25 NOTE — Telephone Encounter (Signed)
 FYI Only or Action Required?: FYI only for provider.  Patient was last seen in primary care on 10/26/2023 by Tanda Bleacher, MD.  Called Nurse Triage reporting Back Pain.  Symptoms began several days ago.  Interventions attempted: Nothing.  Symptoms are: gradually worsening.  Triage Disposition: See HCP Within 4 Hours (Or PCP Triage)  Patient/caregiver understands and will follow disposition?: Yes  Copied from CRM #[id number]. Topic: Clinical - Red Word Triage >> May 25, 2024  8:18 AM Ivette P wrote: Red Word that prompted transfer to Nurse Triage: believes has a kidney infection- hurting lower back, started 2-3 days ago. pain level is at 8 Reason for Disposition  [1] SEVERE back pain (e.g., excruciating, unable to do any normal activities) AND [2] not improved 2 hours after pain medicine  Answer Assessment - Initial Assessment Questions 1. ONSET: When did the pain begin? (e.g., minutes, hours, days)     3 days ago 2. LOCATION: Where does it hurt? (upper, mid or lower back)     Lower left back 3. SEVERITY: How bad is the pain?  (e.g., Scale 1-10; mild, moderate, or severe)     8/10 4. PATTERN: Is the pain constant? (e.g., yes, no; constant, intermittent)     Comes and goes 5. RADIATION: Does the pain shoot into your legs or somewhere else?     denies 6. CAUSE:  What do you think is causing the back pain?      UTI or kidney infection 7. BACK OVERUSE:  Any recent lifting of heavy objects, strenuous work or exercise?     denies 8. MEDICINES: What have you taken so far for the pain? (e.g., nothing, acetaminophen , NSAIDS)     Bair aspirin  9. NEUROLOGIC SYMPTOMS: Do you have any weakness, numbness, or problems with bowel/bladder control?     Pressure when urinating.  10. OTHER SYMPTOMS: Do you have any other symptoms? (e.g., fever, abdomen pain, burning with urination, blood in urine)       Pressure when urinating, migraine headache, elevated bp 157/107 11.  PREGNANCY: Is there any chance you are pregnant? When was your last menstrual period?       na  Protocols used: Back Pain-A-AH

## 2024-05-25 NOTE — ED Triage Notes (Addendum)
 Pt reports L flank pain 3 days ago. States she had a kidney infection years and this pain is similar. Now has having pain in other areas of her back. Some pressure when using the restroom last night. She tried [employer]ayer back and body without relief.

## 2024-05-26 LAB — URINE CULTURE: Culture: 10000 — AB

## 2024-05-27 ENCOUNTER — Ambulatory Visit (HOSPITAL_COMMUNITY): Payer: Self-pay

## 2024-06-22 ENCOUNTER — Encounter: Payer: Self-pay | Admitting: Family Medicine

## 2024-06-22 ENCOUNTER — Ambulatory Visit (INDEPENDENT_AMBULATORY_CARE_PROVIDER_SITE_OTHER): Admitting: Family Medicine

## 2024-06-22 ENCOUNTER — Ambulatory Visit: Payer: Self-pay | Admitting: Family Medicine

## 2024-06-22 VITALS — BP 141/94 | HR 89 | Ht 64.0 in | Wt 175.2 lb

## 2024-06-22 DIAGNOSIS — R300 Dysuria: Secondary | ICD-10-CM | POA: Diagnosis not present

## 2024-06-22 DIAGNOSIS — I10 Essential (primary) hypertension: Secondary | ICD-10-CM

## 2024-06-22 LAB — POCT URINALYSIS DIP (CLINITEK)
Bilirubin, UA: NEGATIVE
Glucose, UA: NEGATIVE mg/dL
Ketones, POC UA: NEGATIVE mg/dL
Nitrite, UA: NEGATIVE
POC PROTEIN,UA: 100 — AB
Spec Grav, UA: >=1.03 — AB (ref 1.010–1.025)
Urobilinogen, UA: 0.2 U/dL
pH, UA: 7.5 (ref 5.0–8.0)

## 2024-06-22 MED ORDER — NITROFURANTOIN MONOHYD MACRO 100 MG PO CAPS: 100.0000 mg | ORAL_CAPSULE | Freq: Two times a day (BID) | ORAL | 0 refills | Status: AC

## 2024-06-22 MED ORDER — LOSARTAN POTASSIUM 100 MG PO TABS: 100.0000 mg | ORAL_TABLET | Freq: Every day | ORAL | 0 refills | Status: AC

## 2024-06-23 ENCOUNTER — Encounter: Payer: Self-pay | Admitting: Family Medicine

## 2024-06-23 NOTE — Progress Notes (Signed)
 Established Patient Office Visit  Subjective    Patient ID: Courtney Torres, female    DOB: 1981-04-22  Age: 43 y.o. MRN: [id number]  CC:  Chief Complaint  Patient presents with   Medical Management of Chronic Issues    Possible UTI  Would like flu shot     HPI Courtney Torres presents for follow up of hypertension. Patient also reports some burning with urination.   Outpatient Encounter Medications as of 06/22/2024  Medication Sig   ALPRAZolam (XANAX) 0.5 MG tablet Take 0.5 mg by mouth daily as needed.   nitrofurantoin, macrocrystal-monohydrate, (MACROBID) 100 MG capsule Take 1 capsule (100 mg total) by mouth 2 (two) times daily.   QUEtiapine (SEROQUEL) 100 MG tablet Take 100 mg by mouth at bedtime.   venlafaxine XR (EFFEXOR-XR) 150 MG 24 hr capsule Take 150 mg by mouth daily.   venlafaxine XR (EFFEXOR-XR) 75 MG 24 hr capsule Take by mouth.   [DISCONTINUED] losartan  (COZAAR ) 100 MG tablet TAKE 1 TABLET BY MOUTH EVERY DAY   cyclobenzaprine (FLEXERIL) 10 MG tablet Take 1 tablet (10 mg total) by mouth at bedtime.   LORazepam (ATIVAN) 0.5 MG tablet Take 0.5 mg by mouth daily as needed. (Patient not taking: Reported on 09/14/2023)   losartan  (COZAAR ) 100 MG tablet Take 1 tablet (100 mg total) by mouth daily.   methocarbamol (ROBAXIN) 500 MG tablet Take 1 tablet (500 mg total) by mouth every morning. (Patient not taking: Reported on 06/22/2024)   metroNIDAZOLE  (FLAGYL ) 500 MG tablet Take 1 tablet (500 mg total) by mouth 2 (two) times daily. (Patient not taking: Reported on 09/14/2023)   naproxen (NAPROSYN) 500 MG tablet Take 1 tablet (500 mg total) by mouth 2 (two) times daily with a meal. Take with food to avoid stomach upset. Do not take any additional NSAIDs while on this. You may take tylenol  in addition to this if needed for extra pain relief. (Patient not taking: Reported on 06/22/2024)   [DISCONTINUED] losartan  (COZAAR ) 50 MG tablet Take 1 tablet (50 mg total) by mouth  daily. (Patient not taking: Reported on 06/22/2024)   No facility-administered encounter medications on file as of 06/22/2024.    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   Depression    Pregnancy induced hypertension    Preterm labor     Past Surgical History:  Procedure Laterality Date   CERVICAL BIOPSY  W/ LOOP ELECTRODE EXCISION     CESAREAN SECTION     COLPOSCOPY     TONSILLECTOMY AND ADENOIDECTOMY     TUBAL LIGATION N/A 08/16/2020   Procedure: POST PARTUM TUBAL LIGATION;  Surgeon: Lorence Ozell CROME, MD;  Location: [facility];  Service: Gynecology;  Laterality: N/A;    Family History  Problem Relation Age of Onset   Hypertension Father    Arthritis Father    Asthma Father    COPD Father    Heart failure Sister    Early death Sister    Heart disease Sister    Hypertension Brother    Alcohol abuse Brother    Drug abuse Brother    Hypertension Paternal Grandfather    Anxiety disorder Mother    Arthritis Mother    Depression Mother     Social History   Socioeconomic History   Marital status: Married    Spouse name: Jolana Runkles   Number of children: 2  Years of education: Not on file   Highest education level: Not on file  Occupational History   Occupation: unemployed  Tobacco Use   Smoking status: Former    Current packs/day: 0.50    Types: Cigarettes   Smokeless tobacco: Never  Vaping Use   Vaping status: Some Days  Substance and Sexual Activity   Alcohol use: Not Currently    Comment: occ   Drug use: No   Sexual activity: Yes    Birth control/protection: Surgical  Other Topics Concern   Not on file  Social History Narrative   Not on file   Social Drivers of Health   Financial Resource Strain: Low Risk  (09/14/2023)   Overall Financial Resource Strain (CARDIA)    Difficulty of Paying Living Expenses: Not hard at all  Food Insecurity: No Food Insecurity (09/14/2023)    Hunger Vital Sign    Worried About Running Out of Food in the Last Year: Never true    Ran Out of Food in the Last Year: Never true  Transportation Needs: No Transportation Needs (09/14/2023)   PRAPARE - Administrator, Civil Service (Medical): No    Lack of Transportation (Non-Medical): No  Physical Activity: Sufficiently Active (09/14/2023)   Exercise Vital Sign    Days of Exercise per Week: 4 days    Minutes of Exercise per Session: 60 min  Stress: Stress Concern Present (09/14/2023)   [employer] of Occupational Health - Occupational Stress Questionnaire    Feeling of Stress : Rather much  Social Connections: Moderately Integrated (09/14/2023)   Social Connection and Isolation Panel    Frequency of Communication with Friends and Family: More than three times a week    Frequency of Social Gatherings with Friends and Family: Patient unable to answer    Attends Religious Services: More than 4 times per year    Active Member of [employer] or Organizations: No    Attends Banker Meetings: Never    Marital Status: Married  Catering Manager Violence: Not At Risk (09/14/2023)   Humiliation, Afraid, Rape, and Kick questionnaire    Fear of Current or Ex-Partner: No    Emotionally Abused: No    Physically Abused: No    Sexually Abused: No    Review of Systems  Genitourinary:  Positive for dysuria.  All other systems reviewed and are negative.       Objective    BP (!) 141/94   Pulse 89   Ht 5' 4 (1.626 m)   Wt 175 lb 3.2 oz (79.5 kg)   LMP 06/16/2024 (Approximate)   SpO2 96%   BMI 30.07 kg/m   Physical Exam Vitals and nursing note reviewed.  Constitutional:      General: She is not in acute distress. Cardiovascular:     Rate and Rhythm: Normal rate and regular rhythm.  Pulmonary:     Effort: Pulmonary effort is normal.     Breath sounds: Normal breath sounds.  Abdominal:     Palpations: Abdomen is soft.     Tenderness: There is no abdominal  tenderness.  Neurological:     General: No focal deficit present.     Mental Status: She is alert and oriented to person, place, and time.         Assessment & Plan:   Dysuria -     POCT URINALYSIS DIP (CLINITEK)  Essential hypertension  Other orders -     Nitrofurantoin Monohyd Macro; Take 1 capsule (100  mg total) by mouth 2 (two) times daily.  Dispense: 10 capsule; Refill: 0 -     Losartan  Potassium; Take 1 tablet (100 mg total) by mouth daily.  Dispense: 90 tablet; Refill: 0     Return in about 3 months (around 09/22/2024) for follow up.   Tanda Raguel SQUIBB, MD

## 2024-09-22 ENCOUNTER — Ambulatory Visit: Admitting: Family Medicine
